# Patient Record
Sex: Female | Born: 1995 | Race: Black or African American | Hispanic: No | Marital: Single | Smoking: Never smoker
Health system: Southern US, Community
[De-identification: ages and names within clinical notes are randomized; demographics above are authoritative.]

## PROBLEM LIST (undated history)

## (undated) ENCOUNTER — Inpatient Hospital Stay (HOSPITAL_COMMUNITY): Payer: Self-pay

## (undated) DIAGNOSIS — N390 Urinary tract infection, site not specified: Secondary | ICD-10-CM

## (undated) DIAGNOSIS — L309 Dermatitis, unspecified: Secondary | ICD-10-CM

## (undated) DIAGNOSIS — F32A Depression, unspecified: Secondary | ICD-10-CM

## (undated) DIAGNOSIS — F419 Anxiety disorder, unspecified: Secondary | ICD-10-CM

## (undated) DIAGNOSIS — J45909 Unspecified asthma, uncomplicated: Secondary | ICD-10-CM

## (undated) HISTORY — PX: OTHER SURGICAL HISTORY: SHX169

## (undated) HISTORY — PX: NO PAST SURGERIES: SHX2092

## (undated) HISTORY — DX: Dermatitis, unspecified: L30.9

## (undated) HISTORY — PX: HERNIA REPAIR: SHX51

---
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
## 2014-05-15 NOTE — L&D Delivery Note (Cosign Needed)
Delivery Note At 12:00 AM a viable female was delivered via [facility] Vaginal, Vacuum (Extractor) (Presentation: Left Occiput Anterior) by Dr. Jolayne Panther due to maternal exhaustion and slowed progress after 3 hours of pushing.  No pop offs. The shoulders were not forthcoming, 1st maneuver: McRobert's, 2nd maneuver: suprapubic pressure which resolved dystocia. Total dystocia time ~30sec. APGAR: 8, 9; weight: pending at time of note.   Placenta status: Intact, Spontaneous.  Cord: 3 vessels with the following complications: None.  Cord pH: pending  Anesthesia: Epidural  Episiotomy: None Lacerations: 2nd degree Suture Repair: 3.0 vicryl rapide Est. Blood Loss (mL):  200  Mom to postpartum.  Baby to Couplet care / Skin to Skin. Plans to breastfeed, POPs for contraception.   [name] 11/01/2014, 12:37 AM

## 2014-06-11 ENCOUNTER — Other Ambulatory Visit (HOSPITAL_COMMUNITY): Payer: Self-pay | Admitting: Urology

## 2014-06-11 DIAGNOSIS — Z3689 Encounter for other specified antenatal screening: Secondary | ICD-10-CM

## 2014-06-11 LAB — OB RESULTS CONSOLE HGB/HCT, BLOOD
HEMATOCRIT: 33 %
Hemoglobin: 10.6 g/dL

## 2014-06-11 LAB — OB RESULTS CONSOLE RUBELLA ANTIBODY, IGM: Rubella: IMMUNE

## 2014-06-11 LAB — OB RESULTS CONSOLE PLATELET COUNT: PLATELETS: 297 10*3/uL

## 2014-06-11 LAB — OB RESULTS CONSOLE GC/CHLAMYDIA
Chlamydia: POSITIVE
Gonorrhea: NEGATIVE

## 2014-06-11 LAB — GLUCOSE TOLERANCE, 1 HOUR (50G) W/O FASTING: Glucose 1 Hr Prenatal, POC: 82 mg/dL

## 2014-06-11 LAB — CULTURE, OB URINE: Urine Culture, OB: NEGATIVE

## 2014-06-11 LAB — OB RESULTS CONSOLE ANTIBODY SCREEN: Antibody Screen: NEGATIVE

## 2014-06-11 LAB — DRUG SCREEN, URINE: Drug Screen, Urine: NEGATIVE

## 2014-06-11 LAB — OB RESULTS CONSOLE ABO/RH: RH TYPE: POSITIVE

## 2014-06-11 LAB — OB RESULTS CONSOLE RPR: RPR: NONREACTIVE

## 2014-06-11 LAB — PR MATERNAL SERUM QUAD SCREEN: QUAD SCREEN FOR MFM: NEGATIVE

## 2014-06-11 LAB — OB RESULTS CONSOLE HEPATITIS B SURFACE ANTIGEN: Hepatitis B Surface Ag: NEGATIVE

## 2014-06-11 LAB — OB RESULTS CONSOLE VARICELLA ZOSTER ANTIBODY, IGG: Varicella: IMMUNE

## 2014-06-11 LAB — OB RESULTS CONSOLE HIV ANTIBODY (ROUTINE TESTING): HIV: NONREACTIVE

## 2014-06-11 LAB — SICKLE CELL SCREEN: SICKLE CELL SCREEN: NORMAL

## 2014-06-11 LAB — CYSTIC FIBROSIS DIAGNOSTIC STUDY: INTERPRETATION-CFDNA: NEGATIVE

## 2014-06-18 ENCOUNTER — Other Ambulatory Visit (HOSPITAL_COMMUNITY): Payer: Self-pay | Admitting: Urology

## 2014-06-18 ENCOUNTER — Encounter (HOSPITAL_COMMUNITY): Payer: Self-pay

## 2014-06-18 ENCOUNTER — Ambulatory Visit (HOSPITAL_COMMUNITY)
Admission: RE | Admit: 2014-06-18 | Discharge: 2014-06-18 | Disposition: A | Discharge status: Home / Self Care | Payer: Medicaid Other | Source: Ambulatory Visit | Attending: Urology | Admitting: Urology

## 2014-06-18 DIAGNOSIS — Z3689 Encounter for other specified antenatal screening: Secondary | ICD-10-CM

## 2014-06-18 DIAGNOSIS — Z36 Encounter for antenatal screening of mother: Secondary | ICD-10-CM | POA: Diagnosis present

## 2014-06-18 DIAGNOSIS — Z3A21 21 weeks gestation of pregnancy: Secondary | ICD-10-CM | POA: Insufficient documentation

## 2014-06-26 ENCOUNTER — Other Ambulatory Visit (HOSPITAL_COMMUNITY): Payer: Self-pay | Admitting: Urology

## 2014-06-26 DIAGNOSIS — O402XX1 Polyhydramnios, second trimester, fetus 1: Secondary | ICD-10-CM

## 2014-06-26 DIAGNOSIS — Z0371 Encounter for suspected problem with amniotic cavity and membrane ruled out: Secondary | ICD-10-CM

## 2014-07-06 ENCOUNTER — Inpatient Hospital Stay (HOSPITAL_COMMUNITY)
Admission: AD | Admit: 2014-07-06 | Discharge: 2014-07-07 | Disposition: A | Discharge status: Home / Self Care | Payer: Medicaid Other | Source: Ambulatory Visit | Attending: Obstetrics & Gynecology | Admitting: Obstetrics & Gynecology

## 2014-07-06 ENCOUNTER — Encounter (HOSPITAL_COMMUNITY): Payer: Self-pay

## 2014-07-06 DIAGNOSIS — Z3492 Encounter for supervision of normal pregnancy, unspecified, second trimester: Secondary | ICD-10-CM

## 2014-07-06 DIAGNOSIS — O402XX0 Polyhydramnios, second trimester, not applicable or unspecified: Secondary | ICD-10-CM | POA: Insufficient documentation

## 2014-07-06 DIAGNOSIS — O4692 Antepartum hemorrhage, unspecified, second trimester: Secondary | ICD-10-CM

## 2014-07-06 DIAGNOSIS — Z3A24 24 weeks gestation of pregnancy: Secondary | ICD-10-CM | POA: Diagnosis not present

## 2014-07-06 DIAGNOSIS — N898 Other specified noninflammatory disorders of vagina: Secondary | ICD-10-CM | POA: Diagnosis present

## 2014-07-06 DIAGNOSIS — O4690 Antepartum hemorrhage, unspecified, unspecified trimester: Secondary | ICD-10-CM | POA: Insufficient documentation

## 2014-07-06 HISTORY — DX: Unspecified asthma, uncomplicated: J45.909

## 2014-07-06 NOTE — MAU Note (Signed)
Pt states that after she had intercourse this evening she began to have light pinkish discharge. Pt denies pain.

## 2014-07-07 ENCOUNTER — Inpatient Hospital Stay (HOSPITAL_COMMUNITY): Payer: Medicaid Other

## 2014-07-07 ENCOUNTER — Encounter (HOSPITAL_COMMUNITY): Payer: Self-pay | Admitting: *Deleted

## 2014-07-07 DIAGNOSIS — Z3A24 24 weeks gestation of pregnancy: Secondary | ICD-10-CM | POA: Insufficient documentation

## 2014-07-07 DIAGNOSIS — O4692 Antepartum hemorrhage, unspecified, second trimester: Secondary | ICD-10-CM

## 2014-07-07 DIAGNOSIS — O402XX0 Polyhydramnios, second trimester, not applicable or unspecified: Secondary | ICD-10-CM | POA: Insufficient documentation

## 2014-07-07 DIAGNOSIS — O4690 Antepartum hemorrhage, unspecified, unspecified trimester: Secondary | ICD-10-CM | POA: Insufficient documentation

## 2014-07-07 LAB — URINALYSIS, ROUTINE W REFLEX MICROSCOPIC
Bilirubin Urine: NEGATIVE
Glucose, UA: 100 mg/dL — AB
KETONES UR: 15 mg/dL — AB
LEUKOCYTES UA: NEGATIVE
Nitrite: NEGATIVE
Protein, ur: NEGATIVE mg/dL
SPECIFIC GRAVITY, URINE: 1.025 (ref 1.005–1.030)
Urobilinogen, UA: 1 mg/dL (ref 0.0–1.0)
pH: 6.5 (ref 5.0–8.0)

## 2014-07-07 LAB — URINE MICROSCOPIC-ADD ON

## 2014-07-07 LAB — WET PREP, GENITAL
CLUE CELLS WET PREP: NONE SEEN
TRICH WET PREP: NONE SEEN
Yeast Wet Prep HPF POC: NONE SEEN

## 2014-07-07 LAB — GC/CHLAMYDIA PROBE AMP (~~LOC~~) NOT AT ARMC
CHLAMYDIA, DNA PROBE: NEGATIVE
Neisseria Gonorrhea: NEGATIVE

## 2014-07-07 NOTE — MAU Note (Cosign Needed)
Chief Complaint:  Vaginal Bleeding   Nalani Vanderford is a 19 y.o.  G1P0 with IUP at [redacted]w[redacted]d presenting for Vaginal Bleeding  Patient was having sexual intercourse earlier this evening and noticed some drops of blood after wiping. There was no gush of fluid or large amount of blood. She reports good fetal movements. [facility] Her ultrasound showed mild polyhydramnios and an anterior placenta with no previa.   Menstrual History: OB History    Gravida Para Term Preterm AB TAB SAB Ectopic Multiple Living   1                   Past Medical History  Diagnosis Date  . Asthma     History reviewed. No pertinent past surgical history.  History reviewed. No pertinent family history.  History  Substance Use Topics  . Smoking status: Never Smoker   . Smokeless tobacco: Not on file  . Alcohol Use: No     No Known Allergies  Prescriptions prior to admission  Medication Sig Dispense Refill Last Dose  . Prenatal Vit-Fe Fumarate-FA (MULTIVITAMIN-PRENATAL) 27-0.8 MG TABS tablet Take 1 tablet by mouth daily at 12 noon.       Review of Systems - Negative except for what is mentioned in HPI.  Physical Exam  Blood pressure 116/63, pulse 88, temperature 98.4 F (36.9 C), temperature source Oral, resp. rate 18, height  (1.626 m), weight 61.78 kg (136 lb 3.2 oz). GENERAL: Well-developed, well-nourished female in no acute distress.  LUNGS: normal effort  HEART: Regular  ABDOMEN: Soft, nontender,, gravid.  EXTREMITIES: Nontender, no edema, 2+ distal pulses. GU: mild tears of labia minora, pooled blood in vault  FHT:  Baseline rate 145 bpm   Variability moderate  Accelerations present   Decelerations none Contractions: none   Labs: Results for orders placed or performed during the hospital encounter of 07/06/14 (from the past 24 hour(s))  Urinalysis, Routine w reflex microscopic   Collection Time: 07/06/14 11:41 PM  Result Value Ref Range    Color, Urine YELLOW YELLOW   APPearance CLEAR CLEAR   Specific Gravity, Urine 1.025 1.005 - 1.030   pH 6.5 5.0 - 8.0   Glucose, UA 100 (A) NEGATIVE mg/dL   Hgb urine dipstick TRACE (A) NEGATIVE   Bilirubin Urine NEGATIVE NEGATIVE   Ketones, ur 15 (A) NEGATIVE mg/dL   Protein, ur NEGATIVE NEGATIVE mg/dL   Urobilinogen, UA 1.0 0.0 - 1.0 mg/dL   Nitrite NEGATIVE NEGATIVE   Leukocytes, UA NEGATIVE NEGATIVE  Urine microscopic-add on   Collection Time: 07/06/14 11:41 PM  Result Value Ref Range   Squamous Epithelial / LPF RARE RARE   WBC, UA 0-2 <3 WBC/hpf   RBC / HPF 0-2 <3 RBC/hpf   Bacteria, UA RARE RARE   Urine-Other MUCOUS PRESENT     Imaging Studies:  US Ob Detail + 14 Wk  06/18/2014   OBSTETRICAL ULTRASOUND: This exam was performed within a [facility]. The OB US report was generated in the [facility] system, and faxed to the ordering physician.   This report is available in the [employer]. See the [facility] Obstetric US report via the Image Link.   Assessment: Sumaiyah Markert is  19 y.o. G1P0 at [redacted]w[redacted]d presents with Vaginal Bleeding .  Plan: Vaginal Bleeding: Most likely 2/2 coitus. Wet prep negative, GC/Ch pending. US showing no previa but does show polyhydramnios and breech presentation.  - dc to home.  -  given precautions for return  - has appt this [facility] at [facility].   Mild polyhydramnios: [facility]. She should have a repeat ultrasound ~07/17/14.   Myra RudeSchmitz, Jeremy E 2/23/201612:31 AM  OB fellow attestation:  I have seen and examined this patient; I agree with above documentation in the resident's note.   Rico JunkerDuranda Kimrey is a 19 y.o. G1P0 reporting scant vaginal bleeding after intercourse this evening. She initially noticed a small amount of pink on the toilet paper when wiping. Has not continued to bleed.  +FM, denies LOF, VB, contractions, vaginal discharge.  PE: BP 116/63 mmHg  Pulse 88   Temp(Src) 98.4 F (36.9 C) (Oral)  Resp 18  Ht 5\' 4"  (1.626 m)  Wt 136 lb 3.2 oz (61.78 kg)  BMI 23.37 kg/m2 Gen: calm comfortable, NAD Resp: normal effort, no distress Abd: gravid  ROS, labs, PMH reviewed NST reactive   Plan: #Post coital bleeding Small amount of blood in the vaginal vault on examination. No evidence of active bleeding.  - Wet prep negative.  - GC/CT pending - Ultrasound without evidence of previa or abruption - Follow up scheduled for Thursday, stressed the importance of keeping this appointment as scheduled - Strict VB/FM/preterm labor precautions given   William DaltonMcEachern, Joana Nolton, MD 2:38 AM

## 2014-07-07 NOTE — Discharge Instructions (Signed)
Second Trimester of Pregnancy The second trimester is from week 13 through week 28, months 4 through 6. The second trimester is often a time when you feel your best. Your body has also adjusted to being pregnant, and you begin to feel better physically. Usually, morning sickness has lessened or quit completely, you may have more energy, and you may have an increase in appetite. The second trimester is also a time when the fetus is growing rapidly. At the end of the sixth month, the fetus is about 9 inches long and weighs about 1 pounds. You will likely begin to feel the baby move (quickening) between 18 and 20 weeks of the pregnancy. BODY CHANGES Your body goes through many changes during pregnancy. The changes vary from woman to woman.   Your weight will continue to increase. You will notice your lower abdomen bulging out.  You may begin to get stretch marks on your hips, abdomen, and breasts.  You may develop headaches that can be relieved by medicines approved by your health care provider.  You may urinate more often because the fetus is pressing on your bladder.  You may develop or continue to have heartburn as a result of your pregnancy.  You may develop constipation because certain hormones are causing the muscles that push waste through your intestines to slow down.  You may develop hemorrhoids or swollen, bulging veins (varicose veins).  You may have back pain because of the weight gain and pregnancy hormones relaxing your joints between the bones in your pelvis and as a result of a shift in weight and the muscles that support your balance.  Your breasts will continue to grow and be tender.  Your gums may bleed and may be sensitive to brushing and flossing.  Dark spots or blotches (chloasma, mask of pregnancy) may develop on your face. This will likely fade after the baby is born.  A dark line from your belly button to the pubic area (linea nigra) may appear. This will likely fade  after the baby is born.  You may have changes in your hair. These can include thickening of your hair, rapid growth, and changes in texture. Some women also have hair loss during or after pregnancy, or hair that feels dry or thin. Your hair will most likely return to normal after your baby is born. WHAT TO EXPECT AT YOUR PRENATAL VISITS During a routine prenatal visit:  You will be weighed to make sure you and the fetus are growing normally.  Your blood pressure will be taken.  Your abdomen will be measured to track your baby's growth.  The fetal heartbeat will be listened to.  Any test results from the previous visit will be discussed. Your health care provider may ask you:  How you are feeling.  If you are feeling the baby move.  If you have had any abnormal symptoms, such as leaking fluid, bleeding, severe headaches, or abdominal cramping.  If you have any questions. Other tests that may be performed during your second trimester include:  Blood tests that check for:  Low iron levels (anemia).  Gestational diabetes (between 24 and 28 weeks).  Rh antibodies.  Urine tests to check for infections, diabetes, or protein in the urine.  An ultrasound to confirm the proper growth and development of the baby.  An amniocentesis to check for possible genetic problems.  Fetal screens for spina bifida and Down syndrome. HOME CARE INSTRUCTIONS   Avoid all smoking, herbs, alcohol, and unprescribed   drugs. These chemicals affect the formation and growth of the baby.  Follow your health care provider's instructions regarding medicine use. There are medicines that are either safe or unsafe to take during pregnancy.  Exercise only as directed by your health care provider. Experiencing uterine cramps is a good sign to stop exercising.  Continue to eat regular, healthy meals.  Wear a good support bra for breast tenderness.  Do not use hot tubs, steam rooms, or saunas.  Wear your  seat belt at all times when driving.  Avoid raw meat, uncooked cheese, cat litter boxes, and soil used by cats. These carry germs that can cause birth defects in the baby.  Take your prenatal vitamins.  Try taking a stool softener (if your health care provider approves) if you develop constipation. Eat more high-fiber foods, such as fresh vegetables or fruit and whole grains. Drink plenty of fluids to keep your urine clear or pale yellow.  Take warm sitz baths to soothe any pain or discomfort caused by hemorrhoids. Use hemorrhoid cream if your health care provider approves.  If you develop varicose veins, wear support hose. Elevate your feet for 15 minutes, 3-4 times a day. Limit salt in your diet.  Avoid heavy lifting, wear low heel shoes, and practice good posture.  Rest with your legs elevated if you have leg cramps or low back pain.  Visit your dentist if you have not gone yet during your pregnancy. Use a soft toothbrush to brush your teeth and be gentle when you floss.  A sexual relationship may be continued unless your health care provider directs you otherwise.  Continue to go to all your prenatal visits as directed by your health care provider. SEEK MEDICAL CARE IF:   You have dizziness.  You have mild pelvic cramps, pelvic pressure, or nagging pain in the abdominal area.  You have persistent nausea, vomiting, or diarrhea.  You have a bad smelling vaginal discharge.  You have pain with urination. SEEK IMMEDIATE MEDICAL CARE IF:   You have a fever.  You are leaking fluid from your vagina.  You have spotting or bleeding from your vagina.  You have severe abdominal cramping or pain.  You have rapid weight gain or loss.  You have shortness of breath with chest pain.  You notice sudden or extreme swelling of your face, hands, ankles, feet, or legs.  You have not felt your baby move in over an hour.  You have severe headaches that do not go away with  medicine.  You have vision changes. Document Released: 04/25/2001 Document Revised: 05/06/2013 Document Reviewed: 07/02/2012 [employer] Patient Information 2015 [employer]. This information is not intended to replace advice given to you by your health care provider. Make sure you discuss any questions you have with your health care provider.  

## 2014-07-16 ENCOUNTER — Encounter (HOSPITAL_COMMUNITY): Payer: Self-pay

## 2014-07-16 ENCOUNTER — Other Ambulatory Visit (HOSPITAL_COMMUNITY): Payer: Self-pay | Admitting: Urology

## 2014-07-16 ENCOUNTER — Ambulatory Visit (HOSPITAL_COMMUNITY)
Admission: RE | Admit: 2014-07-16 | Discharge: 2014-07-16 | Disposition: A | Discharge status: Home / Self Care | Payer: Medicaid Other | Source: Ambulatory Visit | Attending: Physician Assistant | Admitting: Physician Assistant

## 2014-07-16 DIAGNOSIS — Z3A25 25 weeks gestation of pregnancy: Secondary | ICD-10-CM | POA: Diagnosis not present

## 2014-07-16 DIAGNOSIS — O402XX0 Polyhydramnios, second trimester, not applicable or unspecified: Secondary | ICD-10-CM | POA: Diagnosis not present

## 2014-07-16 DIAGNOSIS — O409XX0 Polyhydramnios, unspecified trimester, not applicable or unspecified: Secondary | ICD-10-CM | POA: Insufficient documentation

## 2014-07-16 DIAGNOSIS — Z0371 Encounter for suspected problem with amniotic cavity and membrane ruled out: Secondary | ICD-10-CM

## 2014-07-23 ENCOUNTER — Encounter: Payer: Medicaid Other | Admitting: Family Medicine

## 2014-07-29 ENCOUNTER — Other Ambulatory Visit (HOSPITAL_COMMUNITY): Payer: Self-pay | Admitting: Obstetrics and Gynecology

## 2014-07-29 DIAGNOSIS — O409XX9 Polyhydramnios, unspecified trimester, other fetus: Secondary | ICD-10-CM

## 2014-08-03 DIAGNOSIS — Z202 Contact with and (suspected) exposure to infections with a predominantly sexual mode of transmission: Secondary | ICD-10-CM | POA: Insufficient documentation

## 2014-08-03 DIAGNOSIS — D649 Anemia, unspecified: Secondary | ICD-10-CM | POA: Insufficient documentation

## 2014-08-05 ENCOUNTER — Encounter: Payer: Self-pay | Admitting: *Deleted

## 2014-08-06 ENCOUNTER — Encounter: Payer: Self-pay | Admitting: Obstetrics and Gynecology

## 2014-08-06 ENCOUNTER — Encounter (HOSPITAL_COMMUNITY): Payer: Self-pay

## 2014-08-06 ENCOUNTER — Ambulatory Visit (HOSPITAL_COMMUNITY)
Admission: RE | Admit: 2014-08-06 | Discharge: 2014-08-06 | Disposition: A | Discharge status: Home / Self Care | Payer: Medicaid Other | Source: Ambulatory Visit | Attending: Obstetrics and Gynecology | Admitting: Obstetrics and Gynecology

## 2014-08-06 ENCOUNTER — Other Ambulatory Visit: Payer: Self-pay | Admitting: Obstetrics and Gynecology

## 2014-08-06 ENCOUNTER — Ambulatory Visit (INDEPENDENT_AMBULATORY_CARE_PROVIDER_SITE_OTHER): Payer: Medicaid Other | Admitting: Obstetrics and Gynecology

## 2014-08-06 VITALS — BP 110/60 | HR 81 | Temp 98.2°F | Wt 137.4 lb

## 2014-08-06 DIAGNOSIS — O403XX1 Polyhydramnios, third trimester, fetus 1: Secondary | ICD-10-CM

## 2014-08-06 DIAGNOSIS — Z23 Encounter for immunization: Secondary | ICD-10-CM

## 2014-08-06 DIAGNOSIS — O409XX0 Polyhydramnios, unspecified trimester, not applicable or unspecified: Secondary | ICD-10-CM | POA: Insufficient documentation

## 2014-08-06 DIAGNOSIS — O0993 Supervision of high risk pregnancy, unspecified, third trimester: Secondary | ICD-10-CM | POA: Diagnosis present

## 2014-08-06 DIAGNOSIS — Z3A28 28 weeks gestation of pregnancy: Secondary | ICD-10-CM | POA: Insufficient documentation

## 2014-08-06 DIAGNOSIS — O0990 Supervision of high risk pregnancy, unspecified, unspecified trimester: Secondary | ICD-10-CM | POA: Insufficient documentation

## 2014-08-06 LAB — CBC
HCT: 32.2 % — ABNORMAL LOW (ref 36.0–46.0)
Hemoglobin: 11.1 g/dL — ABNORMAL LOW (ref 12.0–15.0)
MCH: 30.7 pg (ref 26.0–34.0)
MCHC: 34.5 g/dL (ref 30.0–36.0)
MCV: 89.2 fL (ref 78.0–100.0)
MPV: 10.3 fL (ref 8.6–12.4)
Platelets: 252 10*3/uL (ref 150–400)
RBC: 3.61 MIL/uL — AB (ref 3.87–5.11)
RDW: 13.7 % (ref 11.5–15.5)
WBC: 5.8 10*3/uL (ref 4.0–10.5)

## 2014-08-06 LAB — POCT URINALYSIS DIP (DEVICE)
Bilirubin Urine: NEGATIVE
GLUCOSE, UA: NEGATIVE mg/dL
Hgb urine dipstick: NEGATIVE
Ketones, ur: NEGATIVE mg/dL
Nitrite: NEGATIVE
Protein, ur: NEGATIVE mg/dL
Urobilinogen, UA: 0.2 mg/dL (ref 0.0–1.0)
pH: 6 (ref 5.0–8.0)

## 2014-08-06 MED ORDER — TETANUS-DIPHTH-ACELL PERTUSSIS 5-2.5-18.5 LF-MCG/0.5 IM SUSP
0.5000 mL | Freq: Once | INTRAMUSCULAR | Status: AC
  Administered 2014-08-06: 0.5 mL via INTRAMUSCULAR

## 2014-08-06 NOTE — Progress Notes (Signed)
Nutrition note: 1st visit consult Pt has gained 17.4# @ 770w2d, which is wnl. Pt reports eating 3 meals & 4 snacks/d. Pt is taking a PNV. Pt reports no N&V but has some heartburn occ. Pt received verbal & written education on general nutrition during pregnancy. Discussed BF. Discussed wt gain goals of 25-35# or 1#/wk. Pt agrees to continue taking PNV. Pt has WIC & plans to BF. F/u as needed Blondell RevealLaura Govani Radloff, MS, RD, LDN, [facility]CLC

## 2014-08-06 NOTE — Patient Instructions (Signed)
Third Trimester of Pregnancy The third trimester is from week 29 through week 42, months 7 through 9. The third trimester is a time when the fetus is growing rapidly. At the end of the ninth month, the fetus is about 20 inches in length and weighs 6-10 pounds.  BODY CHANGES Your body goes through many changes during pregnancy. The changes vary from woman to woman.   Your weight will continue to increase. You can expect to gain 25-35 pounds (11-16 kg) by the end of the pregnancy.  You may begin to get stretch marks on your hips, abdomen, and breasts.  You may urinate more often because the fetus is moving lower into your pelvis and pressing on your bladder.  You may develop or continue to have heartburn as a result of your pregnancy.  You may develop constipation because certain hormones are causing the muscles that push waste through your intestines to slow down.  You may develop hemorrhoids or swollen, bulging veins (varicose veins).  You may have pelvic pain because of the weight gain and pregnancy hormones relaxing your joints between the bones in your pelvis. Backaches may result from overexertion of the muscles supporting your posture.  You may have changes in your hair. These can include thickening of your hair, rapid growth, and changes in texture. Some women also have hair loss during or after pregnancy, or hair that feels dry or thin. Your hair will most likely return to normal after your baby is born.  Your breasts will continue to grow and be tender. A yellow discharge may leak from your breasts called colostrum.  Your belly button may stick out.  You may feel short of breath because of your expanding uterus.  You may notice the fetus "dropping," or moving lower in your abdomen.  You may have a bloody mucus discharge. This usually occurs a few days to a week before labor begins.  Your cervix becomes thin and soft (effaced) near your due date. WHAT TO EXPECT AT YOUR  PRENATAL EXAMS  You will have prenatal exams every 2 weeks until week 36. Then, you will have weekly prenatal exams. During a routine prenatal visit:  You will be weighed to make sure you and the fetus are growing normally.  Your blood pressure is taken.  Your abdomen will be measured to track your baby's growth.  The fetal heartbeat will be listened to.  Any test results from the previous visit will be discussed.  You may have a cervical check near your due date to see if you have effaced. At around 36 weeks, your caregiver will check your cervix. At the same time, your caregiver will also perform a test on the secretions of the vaginal tissue. This test is to determine if a type of bacteria, Group B streptococcus, is present. Your caregiver will explain this further. Your caregiver may ask you:  What your birth plan is.  How you are feeling.  If you are feeling the baby move.  If you have had any abnormal symptoms, such as leaking fluid, bleeding, severe headaches, or abdominal cramping.  If you have any questions. Other tests or screenings that may be performed during your third trimester include:  Blood tests that check for low iron levels (anemia).  Fetal testing to check the health, activity level, and growth of the fetus. Testing is done if you have certain medical conditions or if there are problems during the pregnancy. FALSE LABOR You may feel small, irregular contractions that   eventually go away. These are called Braxton Hicks contractions, or false labor. Contractions may last for hours, days, or even weeks before true labor sets in. If contractions come at regular intervals, intensify, or become painful, it is best to be seen by your caregiver.  SIGNS OF LABOR   Menstrual-like cramps.  Contractions that are 5 minutes apart or less.  Contractions that start on the top of the uterus and spread down to the lower abdomen and back.  A sense of increased pelvic  pressure or back pain.  A watery or bloody mucus discharge that comes from the vagina. If you have any of these signs before the 37th week of pregnancy, call your caregiver right away. You need to go to the hospital to get checked immediately. HOME CARE INSTRUCTIONS   Avoid all smoking, herbs, alcohol, and unprescribed drugs. These chemicals affect the formation and growth of the baby.  Follow your caregiver's instructions regarding medicine use. There are medicines that are either safe or unsafe to take during pregnancy.  Exercise only as directed by your caregiver. Experiencing uterine cramps is a good sign to stop exercising.  Continue to eat regular, healthy meals.  Wear a good support bra for breast tenderness.  Do not use hot tubs, steam rooms, or saunas.  Wear your seat belt at all times when driving.  Avoid raw meat, uncooked cheese, cat litter boxes, and soil used by cats. These carry germs that can cause birth defects in the baby.  Take your prenatal vitamins.  Try taking a stool softener (if your caregiver approves) if you develop constipation. Eat more high-fiber foods, such as fresh vegetables or fruit and whole grains. Drink plenty of fluids to keep your urine clear or pale yellow.  Take warm sitz baths to soothe any pain or discomfort caused by hemorrhoids. Use hemorrhoid cream if your caregiver approves.  If you develop varicose veins, wear support hose. Elevate your feet for 15 minutes, 3-4 times a day. Limit salt in your diet.  Avoid heavy lifting, wear low heal shoes, and practice good posture.  Rest a lot with your legs elevated if you have leg cramps or low back pain.  Visit your dentist if you have not gone during your pregnancy. Use a soft toothbrush to brush your teeth and be gentle when you floss.  A sexual relationship may be continued unless your caregiver directs you otherwise.  Do not travel far distances unless it is absolutely necessary and only  with the approval of your caregiver.  Take prenatal classes to understand, practice, and ask questions about the labor and delivery.  Make a trial run to the hospital.  Pack your hospital bag.  Prepare the baby's nursery.  Continue to go to all your prenatal visits as directed by your caregiver. SEEK MEDICAL CARE IF:  You are unsure if you are in labor or if your water has broken.  You have dizziness.  You have mild pelvic cramps, pelvic pressure, or nagging pain in your abdominal area.  You have persistent nausea, vomiting, or diarrhea.  You have a bad smelling vaginal discharge.  You have pain with urination. SEEK IMMEDIATE MEDICAL CARE IF:   You have a fever.  You are leaking fluid from your vagina.  You have spotting or bleeding from your vagina.  You have severe abdominal cramping or pain.  You have rapid weight loss or gain.  You have shortness of breath with chest pain.  You notice sudden or extreme swelling   of your face, hands, ankles, feet, or legs.  You have not felt your baby move in over an hour.  You have severe headaches that do not go away with medicine.  You have vision changes. Document Released: 04/25/2001 Document Revised: 05/06/2013 Document Reviewed: 07/02/2012 [employer] Patient Information 2015 [employer]. This information is not intended to replace advice given to you by your health care provider. Make sure you discuss any questions you have with your health care provider.  Contraception Choices Contraception (birth control) is the use of any methods or devices to prevent pregnancy. Below are some methods to help avoid pregnancy. HORMONAL METHODS   Contraceptive implant. This is a thin, plastic tube containing progesterone hormone. It does not contain estrogen hormone. Your health care provider inserts the tube in the inner part of the upper arm. The tube can remain in place for up to 3 years. After 3 years, the implant must be removed.  The implant prevents the ovaries from releasing an egg (ovulation), thickens the cervical mucus to prevent sperm from entering the uterus, and thins the lining of the inside of the uterus.  Progesterone-only injections. These injections are given every 3 months by your health care provider to prevent pregnancy. This synthetic progesterone hormone stops the ovaries from releasing eggs. It also thickens cervical mucus and changes the uterine lining. This makes it harder for sperm to survive in the uterus.  Birth control pills. These pills contain estrogen and progesterone hormone. They work by preventing the ovaries from releasing eggs (ovulation). They also cause the cervical mucus to thicken, preventing the sperm from entering the uterus. Birth control pills are prescribed by a health care provider.Birth control pills can also be used to treat heavy periods.  Minipill. This type of birth control pill contains only the progesterone hormone. They are taken every day of each month and must be prescribed by your health care provider.  Birth control patch. The patch contains hormones similar to those in birth control pills. It must be changed once a week and is prescribed by a health care provider.  Vaginal ring. The ring contains hormones similar to those in birth control pills. It is left in the vagina for 3 weeks, removed for 1 week, and then a new one is put back in place. The patient must be comfortable inserting and removing the ring from the vagina.A health care provider's prescription is necessary.  Emergency contraception. Emergency contraceptives prevent pregnancy after unprotected sexual intercourse. This pill can be taken right after sex or up to 5 days after unprotected sex. It is most effective the sooner you take the pills after having sexual intercourse. Most emergency contraceptive pills are available without a prescription. Check with your pharmacist. Do not use emergency contraception as  your only form of birth control. BARRIER METHODS   Female condom. This is a thin sheath (latex or rubber) that is worn over the penis during sexual intercourse. It can be used with spermicide to increase effectiveness.  Female condom. This is a soft, loose-fitting sheath that is put into the vagina before sexual intercourse.  Diaphragm. This is a soft, latex, dome-shaped barrier that must be fitted by a health care provider. It is inserted into the vagina, along with a spermicidal jelly. It is inserted before intercourse. The diaphragm should be left in the vagina for 6 to 8 hours after intercourse.  Cervical cap. This is a round, soft, latex or plastic cup that fits over the cervix and must be   fitted by a health care provider. The cap can be left in place for up to 48 hours after intercourse.  Sponge. This is a soft, circular piece of polyurethane foam. The sponge has spermicide in it. It is inserted into the vagina after wetting it and before sexual intercourse.  Spermicides. These are chemicals that kill or block sperm from entering the cervix and uterus. They come in the form of creams, jellies, suppositories, foam, or tablets. They do not require a prescription. They are inserted into the vagina with an applicator before having sexual intercourse. The process must be repeated every time you have sexual intercourse. INTRAUTERINE CONTRACEPTION  Intrauterine device (IUD). This is a T-shaped device that is put in a woman's uterus during a menstrual period to prevent pregnancy. There are 2 types:  Copper IUD. This type of IUD is wrapped in copper wire and is placed inside the uterus. Copper makes the uterus and fallopian tubes produce a fluid that kills sperm. It can stay in place for 10 years.  Hormone IUD. This type of IUD contains the hormone progestin (synthetic progesterone). The hormone thickens the cervical mucus and prevents sperm from entering the uterus, and it also thins the uterine  lining to prevent implantation of a fertilized egg. The hormone can weaken or kill the sperm that get into the uterus. It can stay in place for 3-5 years, depending on which type of IUD is used. PERMANENT METHODS OF CONTRACEPTION  Female tubal ligation. This is when the woman's fallopian tubes are surgically sealed, tied, or blocked to prevent the egg from traveling to the uterus.  Hysteroscopic sterilization. This involves placing a small coil or insert into each fallopian tube. Your doctor uses a technique called hysteroscopy to do the procedure. The device causes scar tissue to form. This results in permanent blockage of the fallopian tubes, so the sperm cannot fertilize the egg. It takes about 3 months after the procedure for the tubes to become blocked. You must use another form of birth control for these 3 months.  Female sterilization. This is when the female has the tubes that carry sperm tied off (vasectomy).This blocks sperm from entering the vagina during sexual intercourse. After the procedure, the man can still ejaculate fluid (semen). NATURAL PLANNING METHODS  Natural family planning. This is not having sexual intercourse or using a barrier method (condom, diaphragm, cervical cap) on days the woman could become pregnant.  Calendar method. This is keeping track of the length of each menstrual cycle and identifying when you are fertile.  Ovulation method. This is avoiding sexual intercourse during ovulation.  Symptothermal method. This is avoiding sexual intercourse during ovulation, using a thermometer and ovulation symptoms.  Post-ovulation method. This is timing sexual intercourse after you have ovulated. Regardless of which type or method of contraception you choose, it is important that you use condoms to protect against the transmission of sexually transmitted infections (STIs). Talk with your health care provider about which form of contraception is most appropriate for  you. Document Released: 05/01/2005 Document Revised: 05/06/2013 Document Reviewed: 10/24/2012 [employer] Patient Information 2015 [employer]. This information is not intended to replace advice given to you by your health care provider. Make sure you discuss any questions you have with your health care provider.  Breastfeeding Deciding to breastfeed is one of the best choices you can make for you and your baby. A change in hormones during pregnancy causes your breast tissue to grow and increases the number and size of   your milk ducts. These hormones also allow proteins, sugars, and fats from your blood supply to make breast milk in your milk-producing glands. Hormones prevent breast milk from being released before your baby is born as well as prompt milk flow after birth. Once breastfeeding has begun, thoughts of your baby, as well as his or her sucking or crying, can stimulate the release of milk from your milk-producing glands.  BENEFITS OF BREASTFEEDING For Your Baby  Your first milk (colostrum) helps your baby's digestive system function better.   There are antibodies in your milk that help your baby fight off infections.   Your baby has a lower incidence of asthma, allergies, and sudden infant death syndrome.   The nutrients in breast milk are better for your baby than infant formulas and are designed uniquely for your baby's needs.   Breast milk improves your baby's brain development.   Your baby is less likely to develop other conditions, such as childhood obesity, asthma, or type 2 diabetes mellitus.  For You   Breastfeeding helps to create a very special bond between you and your baby.   Breastfeeding is convenient. Breast milk is always available at the correct temperature and costs nothing.   Breastfeeding helps to burn calories and helps you lose the weight gained during pregnancy.   Breastfeeding makes your uterus contract to its prepregnancy size faster and slows  bleeding (lochia) after you give birth.   Breastfeeding helps to lower your risk of developing type 2 diabetes mellitus, osteoporosis, and breast or ovarian cancer later in life. SIGNS THAT YOUR BABY IS HUNGRY Early Signs of Hunger  Increased alertness or activity.  Stretching.  Movement of the head from side to side.  Movement of the head and opening of the mouth when the corner of the mouth or cheek is stroked (rooting).  Increased sucking sounds, smacking lips, cooing, sighing, or squeaking.  Hand-to-mouth movements.  Increased sucking of fingers or hands. Late Signs of Hunger  Fussing.  Intermittent crying. Extreme Signs of Hunger Signs of extreme hunger will require calming and consoling before your baby will be able to breastfeed successfully. Do not wait for the following signs of extreme hunger to occur before you initiate breastfeeding:   Restlessness.  A loud, strong cry.   Screaming. BREASTFEEDING BASICS Breastfeeding Initiation  Find a comfortable place to sit or lie down, with your neck and back well supported.  Place a pillow or rolled up blanket under your baby to bring him or her to the level of your breast (if you are seated). Nursing pillows are specially designed to help support your arms and your baby while you breastfeed.  Make sure that your baby's abdomen is facing your abdomen.   Gently massage your breast. With your fingertips, massage from your chest wall toward your nipple in a circular motion. This encourages milk flow. You may need to continue this action during the feeding if your milk flows slowly.  Support your breast with 4 fingers underneath and your thumb above your nipple. Make sure your fingers are well away from your nipple and your baby's mouth.   Stroke your baby's lips gently with your finger or nipple.   When your baby's mouth is open wide enough, quickly bring your baby to your breast, placing your entire nipple and as  much of the colored area around your nipple (areola) as possible into your baby's mouth.   More areola should be visible above your baby's upper lip than below   the lower lip.   Your baby's tongue should be between his or her lower gum and your breast.   Ensure that your baby's mouth is correctly positioned around your nipple (latched). Your baby's lips should create a seal on your breast and be turned out (everted).  It is common for your baby to suck about 2-3 minutes in order to start the flow of breast milk. Latching Teaching your baby how to latch on to your breast properly is very important. An improper latch can cause nipple pain and decreased milk supply for you and poor weight gain in your baby. Also, if your baby is not latched onto your nipple properly, he or she may swallow some air during feeding. This can make your baby fussy. Burping your baby when you switch breasts during the feeding can help to get rid of the air. However, teaching your baby to latch on properly is still the best way to prevent fussiness from swallowing air while breastfeeding. Signs that your baby has successfully latched on to your nipple:    Silent tugging or silent sucking, without causing you pain.   Swallowing heard between every 3-4 sucks.    Muscle movement above and in front of his or her ears while sucking.  Signs that your baby has not successfully latched on to nipple:   Sucking sounds or smacking sounds from your baby while breastfeeding.  Nipple pain. If you think your baby has not latched on correctly, slip your finger into the corner of your baby's mouth to break the suction and place it between your baby's gums. Attempt breastfeeding initiation again. Signs of Successful Breastfeeding Signs from your baby:   A gradual decrease in the number of sucks or complete cessation of sucking.   Falling asleep.   Relaxation of his or her body.   Retention of a small amount of milk in  his or her mouth.   Letting go of your breast by himself or herself. Signs from you:  Breasts that have increased in firmness, weight, and size 1-3 hours after feeding.   Breasts that are softer immediately after breastfeeding.  Increased milk volume, as well as a change in milk consistency and color by the fifth day of breastfeeding.   Nipples that are not sore, cracked, or bleeding. Signs That Your Baby is Getting Enough Milk  Wetting at least 3 diapers in a 24-hour period. The urine should be clear and pale yellow by age 5 days.  At least 3 stools in a 24-hour period by age 5 days. The stool should be soft and yellow.  At least 3 stools in a 24-hour period by age 7 days. The stool should be seedy and yellow.  No loss of weight greater than 10% of birth weight during the first 3 days of age.  Average weight gain of 4-7 ounces (113-198 g) per week after age 4 days.  Consistent daily weight gain by age 5 days, without weight loss after the age of 2 weeks. After a feeding, your baby may spit up a small amount. This is common. BREASTFEEDING FREQUENCY AND DURATION Frequent feeding will help you make more milk and can prevent sore nipples and breast engorgement. Breastfeed when you feel the need to reduce the fullness of your breasts or when your baby shows signs of hunger. This is called "breastfeeding on demand." Avoid introducing a pacifier to your baby while you are working to establish breastfeeding (the first 4-6 weeks after your baby is born).   After this time you may choose to use a pacifier. Research has shown that pacifier use during the first year of a baby's life decreases the risk of sudden infant death syndrome (SIDS). Allow your baby to feed on each breast as long as he or she wants. Breastfeed until your baby is finished feeding. When your baby unlatches or falls asleep while feeding from the first breast, offer the second breast. Because newborns are often sleepy in the  first few weeks of life, you may need to awaken your baby to get him or her to feed. Breastfeeding times will vary from baby to baby. However, the following rules can serve as a guide to help you ensure that your baby is properly fed:  Newborns (babies 4 weeks of age or younger) may breastfeed every 1-3 hours.  Newborns should not go longer than 3 hours during the day or 5 hours during the night without breastfeeding.  You should breastfeed your baby a minimum of 8 times in a 24-hour period until you begin to introduce solid foods to your baby at around 6 months of age. BREAST MILK PUMPING Pumping and storing breast milk allows you to ensure that your baby is exclusively fed your breast milk, even at times when you are unable to breastfeed. This is especially important if you are going back to work while you are still breastfeeding or when you are not able to be present during feedings. Your lactation consultant can give you guidelines on how long it is safe to store breast milk.  A breast pump is a machine that allows you to pump milk from your breast into a sterile bottle. The pumped breast milk can then be stored in a refrigerator or freezer. Some breast pumps are operated by hand, while others use electricity. Ask your lactation consultant which type will work best for you. Breast pumps can be purchased, but some hospitals and breastfeeding support groups lease breast pumps on a monthly basis. A lactation consultant can teach you how to hand express breast milk, if you prefer not to use a pump.  CARING FOR YOUR BREASTS WHILE YOU BREASTFEED Nipples can become dry, cracked, and sore while breastfeeding. The following recommendations can help keep your breasts moisturized and healthy:  Avoid using soap on your nipples.   Wear a supportive bra. Although not required, special nursing bras and tank tops are designed to allow access to your breasts for breastfeeding without taking off your entire bra  or top. Avoid wearing underwire-style bras or extremely tight bras.  Air dry your nipples for 3-4minutes after each feeding.   Use only cotton bra pads to absorb leaked breast milk. Leaking of breast milk between feedings is normal.   Use lanolin on your nipples after breastfeeding. Lanolin helps to maintain your skin's normal moisture barrier. If you use pure lanolin, you do not need to wash it off before feeding your baby again. Pure lanolin is not toxic to your baby. You may also hand express a few drops of breast milk and gently massage that milk into your nipples and allow the milk to air dry. In the first few weeks after giving birth, some women experience extremely full breasts (engorgement). Engorgement can make your breasts feel heavy, warm, and tender to the touch. Engorgement peaks within 3-5 days after you give birth. The following recommendations can help ease engorgement:  Completely empty your breasts while breastfeeding or pumping. You may want to start by applying warm, moist heat (in   the shower or with warm water-soaked hand towels) just before feeding or pumping. This increases circulation and helps the milk flow. If your baby does not completely empty your breasts while breastfeeding, pump any extra milk after he or she is finished.  Wear a snug bra (nursing or regular) or tank top for 1-2 days to signal your body to slightly decrease milk production.  Apply ice packs to your breasts, unless this is too uncomfortable for you.  Make sure that your baby is latched on and positioned properly while breastfeeding. If engorgement persists after 48 hours of following these recommendations, contact your health care provider or a lactation consultant. OVERALL HEALTH CARE RECOMMENDATIONS WHILE BREASTFEEDING  Eat healthy foods. Alternate between meals and snacks, eating 3 of each per day. Because what you eat affects your breast milk, some of the foods may make your baby more irritable  than usual. Avoid eating these foods if you are sure that they are negatively affecting your baby.  Drink milk, fruit juice, and water to satisfy your thirst (about 10 glasses a day).   Rest often, relax, and continue to take your prenatal vitamins to prevent fatigue, stress, and anemia.  Continue breast self-awareness checks.  Avoid chewing and smoking tobacco.  Avoid alcohol and drug use. Some medicines that may be harmful to your baby can pass through breast milk. It is important to ask your health care provider before taking any medicine, including all over-the-counter and prescription medicine as well as vitamin and herbal supplements. It is possible to become pregnant while breastfeeding. If birth control is desired, ask your health care provider about options that will be safe for your baby. SEEK MEDICAL CARE IF:   You feel like you want to stop breastfeeding or have become frustrated with breastfeeding.  You have painful breasts or nipples.  Your nipples are cracked or bleeding.  Your breasts are red, tender, or warm.  You have a swollen area on either breast.  You have a fever or chills.  You have nausea or vomiting.  You have drainage other than breast milk from your nipples.  Your breasts do not become full before feedings by the fifth day after you give birth.  You feel sad and depressed.  Your baby is too sleepy to eat well.  Your baby is having trouble sleeping.   Your baby is wetting less than 3 diapers in a 24-hour period.  Your baby has less than 3 stools in a 24-hour period.  Your baby's skin or the white part of his or her eyes becomes yellow.   Your baby is not gaining weight by 5 days of age. SEEK IMMEDIATE MEDICAL CARE IF:   Your baby is overly tired (lethargic) and does not want to wake up and feed.  Your baby develops an unexplained fever. Document Released: 05/01/2005 Document Revised: 05/06/2013 Document Reviewed: 10/23/2012 ExitCare  Patient Information 2015 [employer]. This information is not intended to replace advice given to you by your health care provider. Make sure you discuss any questions you have with your health care provider.  

## 2014-08-06 NOTE — Progress Notes (Signed)
Weekly BPPs with [facility] 08/06/14 @ 315p, BPP and growth U/S 08/13/14 @ 9a.

## 2014-08-06 NOTE — Progress Notes (Signed)
   Subjective:    Kristy Miller is a G1P0 7629w2d being seen today for her first obstetrical visit.  Her obstetrical history is significant for polyhydramnios. Patient does intend to breast feed. Pregnancy history fully reviewed.  Patient reports no complaints.  Filed Vitals:   08/06/14 0907  BP: 110/60  Pulse: 81  Temp: 98.2 F (36.8 C)  Weight: 137 lb 6.4 oz (62.324 kg)    HISTORY: OB History  Gravida Para Term Preterm AB SAB TAB Ectopic Multiple Living  1             # Outcome Date GA Lbr Len/2nd Weight Sex Delivery Anes PTL Lv  1 Current              Past Medical History  Diagnosis Date  . Asthma   . Eczema    Past Surgical History  Procedure Laterality Date  . Other surgical history      age 89 , surgery on abd? not sure    Family History  Problem Relation Age of Onset  . Adopted: Yes     Exam    Nor indicated    Assessment:    Pregnancy: G1P0 Patient Active Problem List   Diagnosis Date Noted  . Supervision of high risk pregnancy, antepartum 08/06/2014  . Polyhydramnios, antepartum 08/06/2014  . Anemia 08/03/2014  . Chlamydia contact, treated 08/03/2014        Plan:     Initial labs reviewed Prenatal vitamins. Problem list reviewed and updated. Genetic Screening discussed Quad Screen: results reviewed.  Ultrasound discussed; fetal survey: follow up scheduled on 3/31.  Follow up in 2 weeks. Weekly BPP until 3/31 ultrasound 50% of 30 min visit spent on counseling and coordination of care.  1 hr GCT, labs and TDap today   Wasil Wolke 08/06/2014

## 2014-08-06 NOTE — Progress Notes (Signed)
Here for first ob visit , [facility]. C/o contractions occasionally, not every day. Given 28 week education packet.

## 2014-08-07 LAB — GLUCOSE TOLERANCE, 1 HOUR (50G) W/O FASTING: Glucose, 1 Hour GTT: 81 mg/dL (ref 70–140)

## 2014-08-07 LAB — RPR

## 2014-08-07 LAB — HIV ANTIBODY (ROUTINE TESTING W REFLEX): HIV 1&2 Ab, 4th Generation: NONREACTIVE

## 2014-08-13 ENCOUNTER — Ambulatory Visit (HOSPITAL_COMMUNITY): Payer: Medicaid Other

## 2014-08-26 ENCOUNTER — Ambulatory Visit (INDEPENDENT_AMBULATORY_CARE_PROVIDER_SITE_OTHER): Payer: Medicaid Other | Admitting: Advanced Practice Midwife

## 2014-08-26 VITALS — BP 116/66 | HR 76 | Temp 97.9°F | Wt 140.3 lb

## 2014-08-26 DIAGNOSIS — O0993 Supervision of high risk pregnancy, unspecified, third trimester: Secondary | ICD-10-CM

## 2014-08-26 LAB — POCT URINALYSIS DIP (DEVICE)
Bilirubin Urine: NEGATIVE
GLUCOSE, UA: NEGATIVE mg/dL
Hgb urine dipstick: NEGATIVE
Ketones, ur: NEGATIVE mg/dL
Nitrite: NEGATIVE
Protein, ur: 30 mg/dL — AB
SPECIFIC GRAVITY, URINE: 1.02 (ref 1.005–1.030)
UROBILINOGEN UA: 1 mg/dL (ref 0.0–1.0)
pH: 7.5 (ref 5.0–8.0)

## 2014-08-26 NOTE — Progress Notes (Signed)
Leukocytes: small 

## 2014-08-26 NOTE — Patient Instructions (Signed)

## 2014-08-26 NOTE — Progress Notes (Signed)
Poly resolved at US 3/24, but MFM recs growth US 3 weeks (tomorrow)

## 2014-08-27 ENCOUNTER — Other Ambulatory Visit (HOSPITAL_COMMUNITY): Payer: Self-pay | Admitting: Obstetrics and Gynecology

## 2014-08-27 ENCOUNTER — Other Ambulatory Visit (HOSPITAL_COMMUNITY): Payer: Self-pay | Admitting: Maternal and Fetal Medicine

## 2014-08-27 ENCOUNTER — Ambulatory Visit (HOSPITAL_COMMUNITY)
Admission: RE | Admit: 2014-08-27 | Discharge: 2014-08-27 | Disposition: A | Discharge status: Home / Self Care | Payer: Medicaid Other | Source: Ambulatory Visit | Attending: Obstetrics and Gynecology | Admitting: Obstetrics and Gynecology

## 2014-08-27 ENCOUNTER — Encounter (HOSPITAL_COMMUNITY): Payer: Self-pay

## 2014-08-27 VITALS — BP 114/80 | HR 92

## 2014-08-27 DIAGNOSIS — O403XX0 Polyhydramnios, third trimester, not applicable or unspecified: Secondary | ICD-10-CM | POA: Diagnosis present

## 2014-08-27 DIAGNOSIS — O403XX1 Polyhydramnios, third trimester, fetus 1: Secondary | ICD-10-CM

## 2014-08-27 DIAGNOSIS — O409XX0 Polyhydramnios, unspecified trimester, not applicable or unspecified: Secondary | ICD-10-CM

## 2014-08-27 DIAGNOSIS — O0993 Supervision of high risk pregnancy, unspecified, third trimester: Secondary | ICD-10-CM

## 2014-08-27 DIAGNOSIS — Z3A31 31 weeks gestation of pregnancy: Secondary | ICD-10-CM | POA: Insufficient documentation

## 2014-08-27 DIAGNOSIS — O409XX9 Polyhydramnios, unspecified trimester, other fetus: Secondary | ICD-10-CM

## 2014-09-09 ENCOUNTER — Ambulatory Visit (INDEPENDENT_AMBULATORY_CARE_PROVIDER_SITE_OTHER): Payer: Medicaid Other | Admitting: Family

## 2014-09-09 VITALS — BP 133/75 | HR 81 | Temp 98.4°F | Wt 141.1 lb

## 2014-09-09 DIAGNOSIS — O403XX1 Polyhydramnios, third trimester, fetus 1: Secondary | ICD-10-CM

## 2014-09-09 DIAGNOSIS — O403XX0 Polyhydramnios, third trimester, not applicable or unspecified: Secondary | ICD-10-CM

## 2014-09-09 DIAGNOSIS — O0993 Supervision of high risk pregnancy, unspecified, third trimester: Secondary | ICD-10-CM

## 2014-09-09 LAB — POCT URINALYSIS DIP (DEVICE)
Bilirubin Urine: NEGATIVE
Glucose, UA: NEGATIVE mg/dL
Hgb urine dipstick: NEGATIVE
Ketones, ur: NEGATIVE mg/dL
Nitrite: NEGATIVE
PH: 7.5 (ref 5.0–8.0)
PROTEIN: 30 mg/dL — AB
SPECIFIC GRAVITY, URINE: 1.015 (ref 1.005–1.030)
Urobilinogen, UA: 1 mg/dL (ref 0.0–1.0)

## 2014-09-09 NOTE — Progress Notes (Signed)
Doing well.  Polyhydramnios resolved; next ultrasound on 09/24/14 to reassess growth.

## 2014-09-09 NOTE — Progress Notes (Signed)
Why Should I Breastfeed? And How Father's Can Support Breastfeeding education sheets given to patient.

## 2014-09-23 ENCOUNTER — Encounter: Payer: Self-pay | Admitting: Physician Assistant

## 2014-09-23 ENCOUNTER — Ambulatory Visit (INDEPENDENT_AMBULATORY_CARE_PROVIDER_SITE_OTHER): Payer: Medicaid Other | Admitting: Physician Assistant

## 2014-09-23 VITALS — BP 114/83 | HR 86 | Temp 98.4°F | Wt 144.5 lb

## 2014-09-23 DIAGNOSIS — O0993 Supervision of high risk pregnancy, unspecified, third trimester: Secondary | ICD-10-CM

## 2014-09-23 LAB — POCT URINALYSIS DIP (DEVICE)
BILIRUBIN URINE: NEGATIVE
GLUCOSE, UA: NEGATIVE mg/dL
Hgb urine dipstick: NEGATIVE
Ketones, ur: NEGATIVE mg/dL
NITRITE: NEGATIVE
PH: 6.5 (ref 5.0–8.0)
Protein, ur: NEGATIVE mg/dL
Specific Gravity, Urine: 1.02 (ref 1.005–1.030)
Urobilinogen, UA: 1 mg/dL (ref 0.0–1.0)

## 2014-09-23 NOTE — Progress Notes (Signed)
Pt is concerned about pain when at work

## 2014-09-23 NOTE — Progress Notes (Signed)
35 weeks, has u/s scheduled for tomorrow to eval fluid (prior poly).  Feeling sore in inner thighs bilaterally.  Endorses good fetal movement.  Denies LOF, VB, dysuria.   Labor precautions RTC 1 week for 36 week visit.

## 2014-09-23 NOTE — Patient Instructions (Signed)
Pain Relief During Labor and Delivery Everyone experiences pain differently, but labor causes severe pain for many women. The amount of pain you experience during labor and delivery depends on your pain tolerance, contraction strength, and your baby's size and position. There are many ways to prepare for and deal with the pain, including:   Taking prenatal classes to learn about labor and delivery. The more informed you are, the less anxious and afraid you may be. This can help lessen the pain.  Taking pain-relieving medicine during labor and delivery.  Learning breathing and relaxation techniques.  Taking a shower or bath.  Getting massaged.  Changing positions.  Placing an ice pack on your back. Discuss your pain control options with your health care provider during your prenatal visits.  WHAT ARE THE TWO TYPES OF PAIN-RELIEVING MEDICINES? 1. Analgesics. These are medicines that decrease pain without total loss of feeling or muscle movement. 2. Anesthetics. These are medicines that block all feeling, including pain. There can be minor side effects of both types, such as nausea, trouble concentrating, becoming sleepy, and lowering the heart rate of the baby. However, health care providers are careful to give doses that will not seriously affect the baby.  WHAT ARE THE SPECIFIC TYPES OF ANALGESICS AND ANESTHETICS? Systemic Analgesic Systemic pain medicines affect your whole body rather than focusing pain relief on the area of your body experiencing pain. This type of medicine is given either through an IV tube in your vein or by a shot (injection) into your muscle. This medicine will lessen your pain but will not stop it completely. It may also make you sleepy, but it will not make you lose consciousness.  Local Anesthetic Local anesthetic isused tonumb a small area of your body. The medicine is injected into the area of nerves that carry feeling to the vagina, vulva, or the area between  the vagina and anus (perineum).  General Anesthetic This type of medicine causes you to lose consciousness so you do not feel pain. It is usually used only in emergency situations during labor. It is given through an IV tube or face mask. Paracervical Block A paracervical block is a form of local anesthesia given during labor. Numbing medicine is injected into the right and left sides of the cervix and vagina. It helps to lessen the pain caused by contractions and stretching of the cervix. It may have to be given more than once.  Pudendal Block A pudendal block is another form of local anesthesia. It is used to relieve the pain associated with pushing or stretching of the perineum at the time of delivery. An injection is given deep through the vaginal wall into the pudendal nerve in the pelvis, numbing the perineum.  Epidural Anesthetic An epidural is an injection of numbing medicine given in the lower back and into the epidural space near your spinal cord. The epidural numbs the lower half of your body. You may be able to move your legs but will not be allowed to walk. Epidurals can be used for labor, delivery, or cesarean deliveries.  To prevent the medicine from wearing off, a small tube (catheter) may be threaded into the epidural space and taped in place to prevent it from slipping out. Medicine can then be given continuously in small doses through the tube until you deliver. Spinal Block A spinal block is similar to an epidural, but the medicine is injected into the spinal fluid, not the epidural space. A spinal block is only given   once. It starts to relieve pain quickly but lasts only 1-2 hours. Spinal blocks can also be used for cesarean deliveries.  Combined Spinal-Epidural Block Combined spinal-epidural blocks combine the benefits of both the spinal and epidural blocks. The spinal part acts quickly to relieve pain and the epidural provides continuous pain relief. Hydrotherapy Immersion in  warm water during labor may provide comfort and relaxation. It may also help to lessen pain, the use of anesthesia, and the length of labor. However, immersion in water during the delivery (water birth) may have some risk involved and studies to determine safety and risks are ongoing. If you are a healthy woman who is expecting an uncomplicated birth, talk with your health care provider to see if water birth is an option for you.  Document Released: 08/17/2008 Document Revised: 05/06/2013 Document Reviewed: 09/19/2012 ExitCare Patient Information 2015 [employer]. This information is not intended to replace advice given to you by your health care provider. Make sure you discuss any questions you have with your health care provider.  

## 2014-09-24 ENCOUNTER — Ambulatory Visit (HOSPITAL_COMMUNITY)
Admission: RE | Admit: 2014-09-24 | Discharge: 2014-09-24 | Disposition: A | Discharge status: Home / Self Care | Payer: Medicaid Other | Source: Ambulatory Visit | Attending: Physician Assistant | Admitting: Physician Assistant

## 2014-09-24 ENCOUNTER — Encounter (HOSPITAL_COMMUNITY): Payer: Self-pay

## 2014-09-24 DIAGNOSIS — Z3A35 35 weeks gestation of pregnancy: Secondary | ICD-10-CM | POA: Insufficient documentation

## 2014-09-24 DIAGNOSIS — O409XX0 Polyhydramnios, unspecified trimester, not applicable or unspecified: Secondary | ICD-10-CM

## 2014-09-24 DIAGNOSIS — O403XX0 Polyhydramnios, third trimester, not applicable or unspecified: Secondary | ICD-10-CM | POA: Diagnosis not present

## 2014-09-30 ENCOUNTER — Ambulatory Visit (INDEPENDENT_AMBULATORY_CARE_PROVIDER_SITE_OTHER): Payer: Medicaid Other | Admitting: Certified Nurse Midwife

## 2014-09-30 ENCOUNTER — Encounter: Payer: Self-pay | Admitting: Certified Nurse Midwife

## 2014-09-30 VITALS — BP 121/73 | HR 62 | Temp 98.0°F | Wt 144.5 lb

## 2014-09-30 DIAGNOSIS — Z3493 Encounter for supervision of normal pregnancy, unspecified, third trimester: Secondary | ICD-10-CM

## 2014-09-30 LAB — POCT URINALYSIS DIP (DEVICE)
Bilirubin Urine: NEGATIVE
Glucose, UA: NEGATIVE mg/dL
Hgb urine dipstick: NEGATIVE
Ketones, ur: NEGATIVE mg/dL
Nitrite: NEGATIVE
Protein, ur: NEGATIVE mg/dL
Specific Gravity, Urine: 1.015 (ref 1.005–1.030)
Urobilinogen, UA: 1 mg/dL (ref 0.0–1.0)
pH: 7 (ref 5.0–8.0)

## 2014-09-30 LAB — OB RESULTS CONSOLE GC/CHLAMYDIA
Chlamydia: NEGATIVE
Gonorrhea: NEGATIVE

## 2014-09-30 LAB — OB RESULTS CONSOLE GBS: STREP GROUP B AG: NEGATIVE

## 2014-09-30 NOTE — Patient Instructions (Signed)
Third Trimester of Pregnancy The third trimester is from week 29 through week 42, months 7 through 9. The third trimester is a time when the fetus is growing rapidly. At the end of the ninth month, the fetus is about 20 inches in length and weighs 6-10 pounds.  BODY CHANGES Your body goes through many changes during pregnancy. The changes vary from woman to woman.   Your weight will continue to increase. You can expect to gain 25-35 pounds (11-16 kg) by the end of the pregnancy.  You may begin to get stretch marks on your hips, abdomen, and breasts.  You may urinate more often because the fetus is moving lower into your pelvis and pressing on your bladder.  You may develop or continue to have heartburn as a result of your pregnancy.  You may develop constipation because certain hormones are causing the muscles that push waste through your intestines to slow down.  You may develop hemorrhoids or swollen, bulging veins (varicose veins).  You may have pelvic pain because of the weight gain and pregnancy hormones relaxing your joints between the bones in your pelvis. Backaches may result from overexertion of the muscles supporting your posture.  You may have changes in your hair. These can include thickening of your hair, rapid growth, and changes in texture. Some women also have hair loss during or after pregnancy, or hair that feels dry or thin. Your hair will most likely return to normal after your baby is born.  Your breasts will continue to grow and be tender. A yellow discharge may leak from your breasts called colostrum.  Your belly button may stick out.  You may feel short of breath because of your expanding uterus.  You may notice the fetus "dropping," or moving lower in your abdomen.  You may have a bloody mucus discharge. This usually occurs a few days to a week before labor begins.  Your cervix becomes thin and soft (effaced) near your due date. WHAT TO EXPECT AT YOUR PRENATAL  EXAMS  You will have prenatal exams every 2 weeks until week 36. Then, you will have weekly prenatal exams. During a routine prenatal visit:  You will be weighed to make sure you and the fetus are growing normally.  Your blood pressure is taken.  Your abdomen will be measured to track your baby's growth.  The fetal heartbeat will be listened to.  Any test results from the previous visit will be discussed.  You may have a cervical check near your due date to see if you have effaced. At around 36 weeks, your caregiver will check your cervix. At the same time, your caregiver will also perform a test on the secretions of the vaginal tissue. This test is to determine if a type of bacteria, Group B streptococcus, is present. Your caregiver will explain this further. Your caregiver may ask you:  What your birth plan is.  How you are feeling.  If you are feeling the baby move.  If you have had any abnormal symptoms, such as leaking fluid, bleeding, severe headaches, or abdominal cramping.  If you have any questions. Other tests or screenings that may be performed during your third trimester include:  Blood tests that check for low iron levels (anemia).  Fetal testing to check the health, activity level, and growth of the fetus. Testing is done if you have certain medical conditions or if there are problems during the pregnancy. FALSE LABOR You may feel small, irregular contractions that   eventually go away. These are called Braxton Hicks contractions, or false labor. Contractions may last for hours, days, or even weeks before true labor sets in. If contractions come at regular intervals, intensify, or become painful, it is best to be seen by your caregiver.  SIGNS OF LABOR   Menstrual-like cramps.  Contractions that are 5 minutes apart or less.  Contractions that start on the top of the uterus and spread down to the lower abdomen and back.  A sense of increased pelvic pressure or back  pain.  A watery or bloody mucus discharge that comes from the vagina. If you have any of these signs before the 37th week of pregnancy, call your caregiver right away. You need to go to the hospital to get checked immediately. HOME CARE INSTRUCTIONS   Avoid all smoking, herbs, alcohol, and unprescribed drugs. These chemicals affect the formation and growth of the baby.  Follow your caregiver's instructions regarding medicine use. There are medicines that are either safe or unsafe to take during pregnancy.  Exercise only as directed by your caregiver. Experiencing uterine cramps is a good sign to stop exercising.  Continue to eat regular, healthy meals.  Wear a good support bra for breast tenderness.  Do not use hot tubs, steam rooms, or saunas.  Wear your seat belt at all times when driving.  Avoid raw meat, uncooked cheese, cat litter boxes, and soil used by cats. These carry germs that can cause birth defects in the baby.  Take your prenatal vitamins.  Try taking a stool softener (if your caregiver approves) if you develop constipation. Eat more high-fiber foods, such as fresh vegetables or fruit and whole grains. Drink plenty of fluids to keep your urine clear or pale yellow.  Take warm sitz baths to soothe any pain or discomfort caused by hemorrhoids. Use hemorrhoid cream if your caregiver approves.  If you develop varicose veins, wear support hose. Elevate your feet for 15 minutes, 3-4 times a day. Limit salt in your diet.  Avoid heavy lifting, wear low heal shoes, and practice good posture.  Rest a lot with your legs elevated if you have leg cramps or low back pain.  Visit your dentist if you have not gone during your pregnancy. Use a soft toothbrush to brush your teeth and be gentle when you floss.  A sexual relationship may be continued unless your caregiver directs you otherwise.  Do not travel far distances unless it is absolutely necessary and only with the approval  of your caregiver.  Take prenatal classes to understand, practice, and ask questions about the labor and delivery.  Make a trial run to the hospital.  Pack your hospital bag.  Prepare the baby's nursery.  Continue to go to all your prenatal visits as directed by your caregiver. SEEK MEDICAL CARE IF:  You are unsure if you are in labor or if your water has broken.  You have dizziness.  You have mild pelvic cramps, pelvic pressure, or nagging pain in your abdominal area.  You have persistent nausea, vomiting, or diarrhea.  You have a bad smelling vaginal discharge.  You have pain with urination. SEEK IMMEDIATE MEDICAL CARE IF:   You have a fever.  You are leaking fluid from your vagina.  You have spotting or bleeding from your vagina.  You have severe abdominal cramping or pain.  You have rapid weight loss or gain.  You have shortness of breath with chest pain.  You notice sudden or extreme swelling   of your face, hands, ankles, feet, or legs.  You have not felt your baby move in over an hour.  You have severe headaches that do not go away with medicine.  You have vision changes. Document Released: 04/25/2001 Document Revised: 05/06/2013 Document Reviewed: 07/02/2012 ExitCare Patient Information 2015 [employer]. This information is not intended to replace advice given to you by your health care provider. Make sure you discuss any questions you have with your health care provider.  

## 2014-09-30 NOTE — Progress Notes (Signed)
Polyhydramnios resolved. FH appropriate. Last u/s revealed several PAC's. Recommend no further testing unless fetal tachycardia is found while doing fht's. No fetal tachycardia noted today.GBS/GC/CHL culture done today.

## 2014-10-01 LAB — GC/CHLAMYDIA PROBE AMP
CT Probe RNA: NEGATIVE
GC Probe RNA: NEGATIVE

## 2014-10-02 LAB — CULTURE, BETA STREP (GROUP B ONLY)

## 2014-10-07 ENCOUNTER — Ambulatory Visit (INDEPENDENT_AMBULATORY_CARE_PROVIDER_SITE_OTHER): Payer: Medicaid Other | Admitting: Certified Nurse Midwife

## 2014-10-07 VITALS — BP 132/77 | HR 72 | Wt 146.8 lb

## 2014-10-07 DIAGNOSIS — O0993 Supervision of high risk pregnancy, unspecified, third trimester: Secondary | ICD-10-CM

## 2014-10-07 LAB — POCT URINALYSIS DIP (DEVICE)
Glucose, UA: NEGATIVE mg/dL
Hgb urine dipstick: NEGATIVE
Ketones, ur: NEGATIVE mg/dL
Nitrite: NEGATIVE
Protein, ur: 30 mg/dL — AB
Specific Gravity, Urine: >=1.03 (ref 1.005–1.030)
Urobilinogen, UA: 1 mg/dL (ref 0.0–1.0)
pH: 6 (ref 5.0–8.0)

## 2014-10-07 NOTE — Patient Instructions (Signed)
Third Trimester of Pregnancy The third trimester is from week 29 through week 42, months 7 through 9. This trimester is when your unborn baby (fetus) is growing very fast. At the end of the ninth month, the unborn baby is about 20 inches in length. It weighs about 6-10 pounds.  HOME CARE   Avoid all smoking, herbs, and alcohol. Avoid drugs not approved by your doctor.  Only take medicine as told by your doctor. Some medicines are safe and some are not during pregnancy.  Exercise only as told by your doctor. Stop exercising if you start having cramps.  Eat regular, healthy meals.  Wear a good support bra if your breasts are tender.  Do not use hot tubs, steam rooms, or saunas.  Wear your seat belt when driving.  Avoid raw meat, uncooked cheese, and liter boxes and soil used by cats.  Take your prenatal vitamins.  Try taking medicine that helps you poop (stool softener) as needed, and if your doctor approves. Eat more fiber by eating fresh fruit, vegetables, and whole grains. Drink enough fluids to keep your pee (urine) clear or pale yellow.  Take warm water baths (sitz baths) to soothe pain or discomfort caused by hemorrhoids. Use hemorrhoid cream if your doctor approves.  If you have puffy, bulging veins (varicose veins), wear support hose. Raise (elevate) your feet for 15 minutes, 3-4 times a day. Limit salt in your diet.  Avoid heavy lifting, wear low heels, and sit up straight.  Rest with your legs raised if you have leg cramps or low back pain.  Visit your dentist if you have not gone during your pregnancy. Use a soft toothbrush to brush your teeth. Be gentle when you floss.  You can have sex (intercourse) unless your doctor tells you not to.  Do not travel far distances unless you must. Only do so with your doctor's approval.  Take prenatal classes.  Practice driving to the hospital.  Pack your hospital bag.  Prepare the baby's room.  Go to your doctor visits. GET  HELP IF:  You are not sure if you are in labor or if your water has broken.  You are dizzy.  You have mild cramps or pressure in your lower belly (abdominal).  You have a nagging pain in your belly area.  You continue to feel sick to your stomach (nauseous), throw up (vomit), or have watery poop (diarrhea).  You have bad smelling fluid coming from your vagina.  You have pain with peeing (urination). GET HELP RIGHT AWAY IF:   You have a fever.  You are leaking fluid from your vagina.  You are spotting or bleeding from your vagina.  You have severe belly cramping or pain.  You lose or gain weight rapidly.  You have trouble catching your breath and have chest pain.  You notice sudden or extreme puffiness (swelling) of your face, hands, ankles, feet, or legs.  You have not felt the baby move in over an hour.  You have severe headaches that do not go away with medicine.  You have vision changes. Document Released: 07/26/2009 Document Revised: 08/26/2012 Document Reviewed: 07/02/2012 ExitCare Patient Information 2015 [employer]. This information is not intended to replace advice given to you by your health care provider. Make sure you discuss any questions you have with your health care provider.  

## 2014-10-07 NOTE — Progress Notes (Signed)
Doing well. Only having a few contractions. Deferred vaginal exam today. GBS negative. GC/Ch cx's negative

## 2014-10-14 ENCOUNTER — Encounter: Payer: Self-pay | Admitting: Physician Assistant

## 2014-10-14 ENCOUNTER — Ambulatory Visit (INDEPENDENT_AMBULATORY_CARE_PROVIDER_SITE_OTHER): Payer: Medicaid Other | Admitting: Physician Assistant

## 2014-10-14 VITALS — BP 137/83 | HR 73 | Wt 146.8 lb

## 2014-10-14 DIAGNOSIS — O471 False labor at or after 37 completed weeks of gestation: Secondary | ICD-10-CM | POA: Diagnosis not present

## 2014-10-14 DIAGNOSIS — O26843 Uterine size-date discrepancy, third trimester: Secondary | ICD-10-CM

## 2014-10-14 LAB — POCT URINALYSIS DIP (DEVICE)
Bilirubin Urine: NEGATIVE
Glucose, UA: NEGATIVE mg/dL
Ketones, ur: NEGATIVE mg/dL
NITRITE: NEGATIVE
PH: 7 (ref 5.0–8.0)
Protein, ur: 30 mg/dL — AB
SPECIFIC GRAVITY, URINE: 1.025 (ref 1.005–1.030)
UROBILINOGEN UA: 1 mg/dL (ref 0.0–1.0)

## 2014-10-14 NOTE — Patient Instructions (Signed)
Pain Relief During Labor and Delivery Everyone experiences pain differently, but labor causes severe pain for many women. The amount of pain you experience during labor and delivery depends on your pain tolerance, contraction strength, and your baby's size and position. There are many ways to prepare for and deal with the pain, including:   Taking prenatal classes to learn about labor and delivery. The more informed you are, the less anxious and afraid you may be. This can help lessen the pain.  Taking pain-relieving medicine during labor and delivery.  Learning breathing and relaxation techniques.  Taking a shower or bath.  Getting massaged.  Changing positions.  Placing an ice pack on your back. Discuss your pain control options with your health care provider during your prenatal visits.  WHAT ARE THE TWO TYPES OF PAIN-RELIEVING MEDICINES? 1. Analgesics. These are medicines that decrease pain without total loss of feeling or muscle movement. 2. Anesthetics. These are medicines that block all feeling, including pain. There can be minor side effects of both types, such as nausea, trouble concentrating, becoming sleepy, and lowering the heart rate of the baby. However, health care providers are careful to give doses that will not seriously affect the baby.  WHAT ARE THE SPECIFIC TYPES OF ANALGESICS AND ANESTHETICS? Systemic Analgesic Systemic pain medicines affect your whole body rather than focusing pain relief on the area of your body experiencing pain. This type of medicine is given either through an IV tube in your vein or by a shot (injection) into your muscle. This medicine will lessen your pain but will not stop it completely. It may also make you sleepy, but it will not make you lose consciousness.  Local Anesthetic Local anesthetic isused tonumb a small area of your body. The medicine is injected into the area of nerves that carry feeling to the vagina, vulva, or the area between  the vagina and anus (perineum).  General Anesthetic This type of medicine causes you to lose consciousness so you do not feel pain. It is usually used only in emergency situations during labor. It is given through an IV tube or face mask. Paracervical Block A paracervical block is a form of local anesthesia given during labor. Numbing medicine is injected into the right and left sides of the cervix and vagina. It helps to lessen the pain caused by contractions and stretching of the cervix. It may have to be given more than once.  Pudendal Block A pudendal block is another form of local anesthesia. It is used to relieve the pain associated with pushing or stretching of the perineum at the time of delivery. An injection is given deep through the vaginal wall into the pudendal nerve in the pelvis, numbing the perineum.  Epidural Anesthetic An epidural is an injection of numbing medicine given in the lower back and into the epidural space near your spinal cord. The epidural numbs the lower half of your body. You may be able to move your legs but will not be allowed to walk. Epidurals can be used for labor, delivery, or cesarean deliveries.  To prevent the medicine from wearing off, a small tube (catheter) may be threaded into the epidural space and taped in place to prevent it from slipping out. Medicine can then be given continuously in small doses through the tube until you deliver. Spinal Block A spinal block is similar to an epidural, but the medicine is injected into the spinal fluid, not the epidural space. A spinal block is only given   once. It starts to relieve pain quickly but lasts only 1-2 hours. Spinal blocks can also be used for cesarean deliveries.  Combined Spinal-Epidural Block Combined spinal-epidural blocks combine the benefits of both the spinal and epidural blocks. The spinal part acts quickly to relieve pain and the epidural provides continuous pain relief. Hydrotherapy Immersion in  warm water during labor may provide comfort and relaxation. It may also help to lessen pain, the use of anesthesia, and the length of labor. However, immersion in water during the delivery (water birth) may have some risk involved and studies to determine safety and risks are ongoing. If you are a healthy woman who is expecting an uncomplicated birth, talk with your health care provider to see if water birth is an option for you.  Document Released: 08/17/2008 Document Revised: 05/06/2013 Document Reviewed: 09/19/2012 ExitCare Patient Information 2015 [employer]. This information is not intended to replace advice given to you by your health care provider. Make sure you discuss any questions you have with your health care provider.  

## 2014-10-14 NOTE — Progress Notes (Signed)
38 weeks with sporadic Braxton Hicks contractions.  Pt reports good fetal movement and denies LOF,VB, dysuria.   Measuring SGA.  Growth u/s on 5/12 was good with EFW  78%.   Pt to have in-office AFI today.  Per Diane - meets criteria for acceptable AFI but there are limited pockets.   AND - pt informs Diane she may have been having sporadic leaking of fluid over the last 2 weeks. Spec inserted.  White physiologic discharge noted.  NO pooling of liquid.  NO fluid from cervix with valsalva.   Pt strongly advised to come in to [facility]U asap with questionable LOF.  Risks discussed. She expresses understanding.   RTC 1 week

## 2014-10-14 NOTE — Progress Notes (Signed)
Has not yet left urine sample.   [id number]: Trace hgb and small leuks in urine.

## 2014-10-22 ENCOUNTER — Ambulatory Visit (INDEPENDENT_AMBULATORY_CARE_PROVIDER_SITE_OTHER): Payer: Medicaid Other | Admitting: Family

## 2014-10-22 VITALS — BP 126/80 | HR 80 | Temp 97.7°F | Wt 144.7 lb

## 2014-10-22 DIAGNOSIS — Z3493 Encounter for supervision of normal pregnancy, unspecified, third trimester: Secondary | ICD-10-CM

## 2014-10-22 DIAGNOSIS — O403XX0 Polyhydramnios, third trimester, not applicable or unspecified: Secondary | ICD-10-CM

## 2014-10-22 DIAGNOSIS — O403XX1 Polyhydramnios, third trimester, fetus 1: Secondary | ICD-10-CM

## 2014-10-22 DIAGNOSIS — O368390 Maternal care for abnormalities of the fetal heart rate or rhythm, unspecified trimester, not applicable or unspecified: Secondary | ICD-10-CM | POA: Insufficient documentation

## 2014-10-22 NOTE — Progress Notes (Signed)
Doing well; no questions or concerns. Reviewed signs of labor and IOL at 41 wks if not delivered.

## 2014-10-22 NOTE — Progress Notes (Signed)
Small leuks and trace protein noted in urine.

## 2014-10-22 NOTE — Progress Notes (Signed)
Reviewed tip of week with patient  

## 2014-10-23 LAB — POCT URINALYSIS DIP (DEVICE)
BILIRUBIN URINE: NEGATIVE
GLUCOSE, UA: NEGATIVE mg/dL
HGB URINE DIPSTICK: NEGATIVE
Ketones, ur: NEGATIVE mg/dL
Nitrite: NEGATIVE
PROTEIN: NEGATIVE mg/dL
Specific Gravity, Urine: 1.02 (ref 1.005–1.030)
Urobilinogen, UA: 1 mg/dL (ref 0.0–1.0)
pH: 6.5 (ref 5.0–8.0)

## 2014-10-28 ENCOUNTER — Ambulatory Visit (INDEPENDENT_AMBULATORY_CARE_PROVIDER_SITE_OTHER): Payer: Medicaid Other | Admitting: Physician Assistant

## 2014-10-28 ENCOUNTER — Telehealth (HOSPITAL_COMMUNITY): Payer: Self-pay | Admitting: *Deleted

## 2014-10-28 VITALS — BP 127/89 | HR 79 | Temp 98.5°F | Wt 146.4 lb

## 2014-10-28 DIAGNOSIS — O403XX0 Polyhydramnios, third trimester, not applicable or unspecified: Secondary | ICD-10-CM | POA: Diagnosis not present

## 2014-10-28 DIAGNOSIS — O26843 Uterine size-date discrepancy, third trimester: Secondary | ICD-10-CM

## 2014-10-28 DIAGNOSIS — O480 Post-term pregnancy: Secondary | ICD-10-CM | POA: Diagnosis present

## 2014-10-28 DIAGNOSIS — Z3493 Encounter for supervision of normal pregnancy, unspecified, third trimester: Secondary | ICD-10-CM | POA: Diagnosis not present

## 2014-10-28 NOTE — Progress Notes (Signed)
[place] for growth and BPP scheduled 6/17 @ 0815.  IOL on 6/22 @ 0001.

## 2014-10-28 NOTE — Telephone Encounter (Signed)
Preadmission screen  

## 2014-10-28 NOTE — Progress Notes (Signed)
No VB, no LOF, irregular contractions. Good fetal movement.

## 2014-10-28 NOTE — Progress Notes (Signed)
Pain- generlized pain and in the groin area

## 2014-10-28 NOTE — Progress Notes (Signed)
40 weeks, no complaints.  Good fetal movement, denies LOF, VB, dysuria.   I measured her at 36 weeks.  Will order BPP for later this week to assess.   NST today.  Sched IOL and twice weekly testing.

## 2014-10-28 NOTE — Patient Instructions (Signed)
Pain Relief During Labor and Delivery Everyone experiences pain differently, but labor causes severe pain for many women. The amount of pain you experience during labor and delivery depends on your pain tolerance, contraction strength, and your baby's size and position. There are many ways to prepare for and deal with the pain, including:   Taking prenatal classes to learn about labor and delivery. The more informed you are, the less anxious and afraid you may be. This can help lessen the pain.  Taking pain-relieving medicine during labor and delivery.  Learning breathing and relaxation techniques.  Taking a shower or bath.  Getting massaged.  Changing positions.  Placing an ice pack on your back. Discuss your pain control options with your health care provider during your prenatal visits.  WHAT ARE THE TWO TYPES OF PAIN-RELIEVING MEDICINES? 1. Analgesics. These are medicines that decrease pain without total loss of feeling or muscle movement. 2. Anesthetics. These are medicines that block all feeling, including pain. There can be minor side effects of both types, such as nausea, trouble concentrating, becoming sleepy, and lowering the heart rate of the baby. However, health care providers are careful to give doses that will not seriously affect the baby.  WHAT ARE THE SPECIFIC TYPES OF ANALGESICS AND ANESTHETICS? Systemic Analgesic Systemic pain medicines affect your whole body rather than focusing pain relief on the area of your body experiencing pain. This type of medicine is given either through an IV tube in your vein or by a shot (injection) into your muscle. This medicine will lessen your pain but will not stop it completely. It may also make you sleepy, but it will not make you lose consciousness.  Local Anesthetic Local anesthetic isused tonumb a small area of your body. The medicine is injected into the area of nerves that carry feeling to the vagina, vulva, or the area between  the vagina and anus (perineum).  General Anesthetic This type of medicine causes you to lose consciousness so you do not feel pain. It is usually used only in emergency situations during labor. It is given through an IV tube or face mask. Paracervical Block A paracervical block is a form of local anesthesia given during labor. Numbing medicine is injected into the right and left sides of the cervix and vagina. It helps to lessen the pain caused by contractions and stretching of the cervix. It may have to be given more than once.  Pudendal Block A pudendal block is another form of local anesthesia. It is used to relieve the pain associated with pushing or stretching of the perineum at the time of delivery. An injection is given deep through the vaginal wall into the pudendal nerve in the pelvis, numbing the perineum.  Epidural Anesthetic An epidural is an injection of numbing medicine given in the lower back and into the epidural space near your spinal cord. The epidural numbs the lower half of your body. You may be able to move your legs but will not be allowed to walk. Epidurals can be used for labor, delivery, or cesarean deliveries.  To prevent the medicine from wearing off, a small tube (catheter) may be threaded into the epidural space and taped in place to prevent it from slipping out. Medicine can then be given continuously in small doses through the tube until you deliver. Spinal Block A spinal block is similar to an epidural, but the medicine is injected into the spinal fluid, not the epidural space. A spinal block is only given   once. It starts to relieve pain quickly but lasts only 1-2 hours. Spinal blocks can also be used for cesarean deliveries.  Combined Spinal-Epidural Block Combined spinal-epidural blocks combine the benefits of both the spinal and epidural blocks. The spinal part acts quickly to relieve pain and the epidural provides continuous pain relief. Hydrotherapy Immersion in  warm water during labor may provide comfort and relaxation. It may also help to lessen pain, the use of anesthesia, and the length of labor. However, immersion in water during the delivery (water birth) may have some risk involved and studies to determine safety and risks are ongoing. If you are a healthy woman who is expecting an uncomplicated birth, talk with your health care provider to see if water birth is an option for you.  Document Released: 08/17/2008 Document Revised: 05/06/2013 Document Reviewed: 09/19/2012 ExitCare Patient Information 2015 [employer]. This information is not intended to replace advice given to you by your health care provider. Make sure you discuss any questions you have with your health care provider.  

## 2014-10-30 ENCOUNTER — Inpatient Hospital Stay (HOSPITAL_COMMUNITY)
Admission: AD | Admit: 2014-10-30 | Discharge: 2014-11-02 | DRG: 774 | Disposition: A | Discharge status: Home / Self Care | Payer: Medicaid Other | Source: Ambulatory Visit | Attending: Obstetrics & Gynecology | Admitting: Obstetrics & Gynecology

## 2014-10-30 ENCOUNTER — Encounter (HOSPITAL_COMMUNITY): Payer: Self-pay | Admitting: *Deleted

## 2014-10-30 ENCOUNTER — Ambulatory Visit (HOSPITAL_COMMUNITY)
Admission: RE | Admit: 2014-10-30 | Discharge: 2014-10-30 | Disposition: A | Discharge status: Home / Self Care | Payer: Medicaid Other | Source: Ambulatory Visit | Attending: Physician Assistant | Admitting: Physician Assistant

## 2014-10-30 DIAGNOSIS — Z3A40 40 weeks gestation of pregnancy: Secondary | ICD-10-CM | POA: Diagnosis not present

## 2014-10-30 DIAGNOSIS — O403XX0 Polyhydramnios, third trimester, not applicable or unspecified: Secondary | ICD-10-CM | POA: Diagnosis present

## 2014-10-30 DIAGNOSIS — O0993 Supervision of high risk pregnancy, unspecified, third trimester: Secondary | ICD-10-CM

## 2014-10-30 DIAGNOSIS — O480 Post-term pregnancy: Secondary | ICD-10-CM | POA: Diagnosis present

## 2014-10-30 DIAGNOSIS — O3663X0 Maternal care for excessive fetal growth, third trimester, not applicable or unspecified: Secondary | ICD-10-CM | POA: Diagnosis present

## 2014-10-30 DIAGNOSIS — O403XX1 Polyhydramnios, third trimester, fetus 1: Secondary | ICD-10-CM

## 2014-10-30 DIAGNOSIS — O26843 Uterine size-date discrepancy, third trimester: Secondary | ICD-10-CM

## 2014-10-30 DIAGNOSIS — O133 Gestational [pregnancy-induced] hypertension without significant proteinuria, third trimester: Principal | ICD-10-CM | POA: Diagnosis present

## 2014-10-30 DIAGNOSIS — O26842 Uterine size-date discrepancy, second trimester: Secondary | ICD-10-CM | POA: Insufficient documentation

## 2014-10-30 DIAGNOSIS — O368390 Maternal care for abnormalities of the fetal heart rate or rhythm, unspecified trimester, not applicable or unspecified: Secondary | ICD-10-CM

## 2014-10-30 LAB — COMPREHENSIVE METABOLIC PANEL
ALT: 23 U/L (ref 14–54)
AST: 28 U/L (ref 15–41)
Albumin: 3.2 g/dL — ABNORMAL LOW (ref 3.5–5.0)
Alkaline Phosphatase: 160 U/L — ABNORMAL HIGH (ref 38–126)
Anion gap: 7 (ref 5–15)
BILIRUBIN TOTAL: 0.5 mg/dL (ref 0.3–1.2)
BUN: 10 mg/dL (ref 6–20)
CALCIUM: 8.8 mg/dL — AB (ref 8.9–10.3)
CHLORIDE: 105 mmol/L (ref 101–111)
CO2: 23 mmol/L (ref 22–32)
CREATININE: 0.79 mg/dL (ref 0.44–1.00)
GLUCOSE: 74 mg/dL (ref 65–99)
Potassium: 3.8 mmol/L (ref 3.5–5.1)
Sodium: 135 mmol/L (ref 135–145)
Total Protein: 6.9 g/dL (ref 6.5–8.1)

## 2014-10-30 LAB — URINALYSIS, ROUTINE W REFLEX MICROSCOPIC
Bilirubin Urine: NEGATIVE
Glucose, UA: NEGATIVE mg/dL
Hgb urine dipstick: NEGATIVE
Ketones, ur: NEGATIVE mg/dL
Nitrite: NEGATIVE
PH: 6 (ref 5.0–8.0)
Protein, ur: NEGATIVE mg/dL
SPECIFIC GRAVITY, URINE: 1.025 (ref 1.005–1.030)
Urobilinogen, UA: 0.2 mg/dL (ref 0.0–1.0)

## 2014-10-30 LAB — CBC
HCT: 34.8 % — ABNORMAL LOW (ref 36.0–46.0)
Hemoglobin: 12.3 g/dL (ref 12.0–15.0)
MCH: 31.1 pg (ref 26.0–34.0)
MCHC: 35.3 g/dL (ref 30.0–36.0)
MCV: 88.1 fL (ref 78.0–100.0)
PLATELETS: 155 10*3/uL (ref 150–400)
RBC: 3.95 MIL/uL (ref 3.87–5.11)
RDW: 13.4 % (ref 11.5–15.5)
WBC: 6 10*3/uL (ref 4.0–10.5)

## 2014-10-30 LAB — PROTEIN / CREATININE RATIO, URINE
CREATININE, URINE: 263 mg/dL
Protein Creatinine Ratio: 0.08 mg/mg{Cre} (ref 0.00–0.15)
TOTAL PROTEIN, URINE: 21 mg/dL

## 2014-10-30 LAB — TYPE AND SCREEN
ABO/RH(D): O POS
Antibody Screen: NEGATIVE

## 2014-10-30 LAB — ABO/RH: ABO/RH(D): O POS

## 2014-10-30 LAB — URINE MICROSCOPIC-ADD ON

## 2014-10-30 MED ORDER — MISOPROSTOL 25 MCG QUARTER TABLET
25.0000 ug | ORAL_TABLET | ORAL | Status: DC
  Administered 2014-10-30 (×2): 25 ug via VAGINAL
  Filled 2014-10-30: qty 0.25
  Filled 2014-10-30 (×3): qty 1
  Filled 2014-10-30 (×2): qty 0.25

## 2014-10-30 MED ORDER — ONDANSETRON HCL 4 MG/2ML IJ SOLN
4.0000 mg | Freq: Four times a day (QID) | INTRAMUSCULAR | Status: DC | PRN
  Administered 2014-10-31: 4 mg via INTRAVENOUS
  Filled 2014-10-30: qty 2

## 2014-10-30 MED ORDER — OXYCODONE-ACETAMINOPHEN 5-325 MG PO TABS: 1.0000 | ORAL_TABLET | ORAL | Status: DC | PRN

## 2014-10-30 MED ORDER — TERBUTALINE SULFATE 1 MG/ML IJ SOLN: 0.2500 mg | Freq: Once | INTRAMUSCULAR | Status: AC | PRN

## 2014-10-30 MED ORDER — LACTATED RINGERS IV SOLN
500.0000 mL | INTRAVENOUS | Status: DC | PRN
  Administered 2014-10-31: 500 mL via INTRAVENOUS

## 2014-10-30 MED ORDER — OXYTOCIN 40 UNITS IN LACTATED RINGERS INFUSION - SIMPLE MED
62.5000 mL/h | INTRAVENOUS | Status: DC
  Administered 2014-11-01: 62.5 mL/h via INTRAVENOUS
  Filled 2014-10-30: qty 1000

## 2014-10-30 MED ORDER — OXYCODONE-ACETAMINOPHEN 5-325 MG PO TABS: 2.0000 | ORAL_TABLET | ORAL | Status: DC | PRN

## 2014-10-30 MED ORDER — OXYTOCIN BOLUS FROM INFUSION: 500.0000 mL | INTRAVENOUS | Status: DC

## 2014-10-30 MED ORDER — ACETAMINOPHEN 325 MG PO TABS
650.0000 mg | ORAL_TABLET | ORAL | Status: DC | PRN
  Administered 2014-10-31 (×2): 650 mg via ORAL
  Filled 2014-10-30 (×2): qty 2

## 2014-10-30 MED ORDER — LIDOCAINE HCL (PF) 1 % IJ SOLN
30.0000 mL | INTRAMUSCULAR | Status: DC | PRN
  Filled 2014-10-30 (×2): qty 30

## 2014-10-30 MED ORDER — LACTATED RINGERS IV SOLN
INTRAVENOUS | Status: DC
  Administered 2014-10-30 – 2014-10-31 (×6): via INTRAVENOUS

## 2014-10-30 MED ORDER — CITRIC ACID-SODIUM CITRATE 334-500 MG/5ML PO SOLN: 30.0000 mL | ORAL | Status: DC | PRN

## 2014-10-30 MED ORDER — FENTANYL CITRATE (PF) 100 MCG/2ML IJ SOLN
100.0000 ug | INTRAMUSCULAR | Status: DC | PRN
  Administered 2014-10-30 – 2014-10-31 (×4): 100 ug via INTRAVENOUS
  Filled 2014-10-30 (×4): qty 2

## 2014-10-30 MED ORDER — ZOLPIDEM TARTRATE 5 MG PO TABS
5.0000 mg | ORAL_TABLET | Freq: Every evening | ORAL | Status: DC | PRN
  Administered 2014-10-31: 5 mg via ORAL
  Filled 2014-10-30 (×2): qty 1

## 2014-10-30 NOTE — ED Notes (Signed)
Pt reports a mild headache this am. No swelling noted.

## 2014-10-30 NOTE — ED Notes (Signed)
Report called to Jolynn Spurlock-Frizzell, RN.  Pt to [facility]U for evaluation. 

## 2014-10-30 NOTE — H&P (Signed)
  History   CSN: [id number]  Arrival date and time: 10/30/14 0930   HPI Patient is 19 y.o. G1P0 [redacted]w[redacted]d by 21wk scan here from [place]. She was getting her post-dates [place] this morning when she was noted to have some elevated BPs. She was sent to the MAU for evaluation. Patient endorses a mild headache that she head this morning that has since resolved. She denies any PIH symptoms.   Patient is scheduled for induction 6/22.  +FM, +contractions(~q95min, irregular), denies LOF, VB, vaginal discharge.   OB History    Gravida Para Term Preterm AB TAB SAB Ectopic Multiple Living   1             -First baby -HRC for polyhydraminos(?resolved)  Past Medical History  Diagnosis Date  . Asthma   . Eczema     Past Surgical History  Procedure Laterality Date  . Other surgical history      age 52 , surgery on abd? not sure     Family History  Problem Relation Age of Onset  . Adopted: Yes    History  Substance Use Topics  . Smoking status: Never Smoker   . Smokeless tobacco: Never Used  . Alcohol Use: No    Allergies: No Known Allergies  Prescriptions prior to admission  Medication Sig Dispense Refill Last Dose  . Prenatal Vit-Fe Fumarate-FA (MULTIVITAMIN-PRENATAL) 27-0.8 MG TABS tablet Take 1 tablet by mouth daily at 12 noon.   10/29/2014 at Unknown time    Review of Systems  Constitutional: Negative for fever and chills.  Eyes: Negative for blurred vision and double vision.  Respiratory: Negative for shortness of breath.  Cardiovascular: Negative for chest pain and leg swelling.  Gastrointestinal: Negative for nausea and vomiting.  Genitourinary: Negative for dysuria.  Neurological: Negative for dizziness and headaches.  Also per HPI  Physical Exam   Blood pressure 132/82, pulse 75, resp. rate 20, last menstrual period 02/05/2014.  Physical Exam  Constitutional: She is oriented to  person, place, and time. She appears well-developed and well-nourished. No distress.  HENT:  Head: Normocephalic and atraumatic.  Eyes: EOM are normal.  Cardiovascular: Normal rate, regular rhythm, normal heart sounds and intact distal pulses.  Respiratory: Effort normal and breath sounds normal.  GI: Soft. Bowel sounds are normal. There is no tenderness.  Genitourinary: Vaginal discharge found; cx 1/60 Musculoskeletal: Normal range of motion. She exhibits no edema or tenderness.  Neurological: She is alert and oriented to person, place, and time.  Skin: Skin is warm and dry.     MAU Course  Procedures - None  MDM: UA unremarkabkle PreE labs: CBC wnl, CMP nl, Pr:Cr 0.08 NST reassuring and reactive [place] results 6/17: AFI 8.89, EFW >90% (8+11)  Assessment and Plan  A: Patient is 19 y.o. G1P0 [redacted]w[redacted]d with:  * mildly elevated pressures today w/ initial PreE work-up that was unremarkable  * US shows pt at risk for LGA w/ EFW >90% (8+11)  * Unfavorable cx    P:  Admit to [employer] Will start ripening due to unfavorable cx Anticipate SVD Watch BPs; at this point no need for mag sulfate but will start if BPs become severe range  Cam Hai CNM 10/30/2014 1:48 PM

## 2014-10-30 NOTE — Progress Notes (Signed)
Nylayah Hawa is a 19 y.o. G1P0 at [redacted]w[redacted]d  admitted for induction of labor due to mild gHTN & at risk for LGA.  Subjective: Feeling mostly comfortable; not having painful ctx  Objective: BP 126/90 mmHg  Pulse 79  Temp(Src) 98.1 F (36.7 C) (Oral)  Resp 20  Ht 5\' 4"  (1.626 m)  Wt 66.679 kg (147 lb)  BMI 25.22 kg/m2  LMP 02/05/2014      FHT:  FHR: 130s bpm, variability: moderate,  accelerations:  Present,  decelerations:  Absent UC:   irregular, every 2-4 minutes SVE:   Dilation: 1 Effacement (%): 60 Station: -1 Exam by:: Philipp Deputy, CNM cx behind vtx which is fairly low  Labs: Lab Results  Component Value Date   WBC 6.0 10/30/2014   HGB 12.3 10/30/2014   HCT 34.8* 10/30/2014   MCV 88.1 10/30/2014   PLT 155 10/30/2014    Assessment / Plan: IUP@term  Mild gHTN At risk for LGA Cx unfavorable  Placed 2nd dose of cytotec Plan for probably 2 more doses overnight followed by foley when cx becomes more anterior  SHAW, KIMBERLY CNM 10/30/2014, 8:47 PM

## 2014-10-31 ENCOUNTER — Inpatient Hospital Stay (HOSPITAL_COMMUNITY): Payer: Medicaid Other | Admitting: Anesthesiology

## 2014-10-31 LAB — CBC
HCT: 34.4 % — ABNORMAL LOW (ref 36.0–46.0)
HEMATOCRIT: 34.7 % — AB (ref 36.0–46.0)
HEMOGLOBIN: 12.3 g/dL (ref 12.0–15.0)
Hemoglobin: 12.4 g/dL (ref 12.0–15.0)
MCH: 31.8 pg (ref 26.0–34.0)
MCH: 31.5 pg (ref 26.0–34.0)
MCHC: 36 g/dL (ref 30.0–36.0)
MCHC: 35.4 g/dL (ref 30.0–36.0)
MCV: 88.2 fL (ref 78.0–100.0)
MCV: 89 fL (ref 78.0–100.0)
PLATELETS: 154 10*3/uL (ref 150–400)
Platelets: 159 10*3/uL (ref 150–400)
RBC: 3.9 MIL/uL (ref 3.87–5.11)
RBC: 3.9 MIL/uL (ref 3.87–5.11)
RDW: 13.3 % (ref 11.5–15.5)
RDW: 13.4 % (ref 11.5–15.5)
WBC: 11.5 10*3/uL — ABNORMAL HIGH (ref 4.0–10.5)
WBC: 15.9 10*3/uL — AB (ref 4.0–10.5)

## 2014-10-31 LAB — RPR: RPR: NONREACTIVE

## 2014-10-31 LAB — HIV ANTIBODY (ROUTINE TESTING W REFLEX): HIV SCREEN 4TH GENERATION: NONREACTIVE

## 2014-10-31 MED ORDER — PHENYLEPHRINE 40 MCG/ML (10ML) SYRINGE FOR IV PUSH (FOR BLOOD PRESSURE SUPPORT)
PREFILLED_SYRINGE | INTRAVENOUS | Status: AC
  Filled 2014-10-31: qty 20

## 2014-10-31 MED ORDER — SODIUM CHLORIDE 0.9 % IV SOLN
2.0000 g | Freq: Four times a day (QID) | INTRAVENOUS | Status: DC
  Administered 2014-10-31: 2 g via INTRAVENOUS
  Filled 2014-10-31 (×4): qty 2000

## 2014-10-31 MED ORDER — BUTORPHANOL TARTRATE 2 MG/ML IJ SOLN
2.0000 mg | INTRAMUSCULAR | Status: DC | PRN
  Administered 2014-10-31: 2 mg via INTRAVENOUS
  Filled 2014-10-31: qty 2

## 2014-10-31 MED ORDER — LIDOCAINE HCL (PF) 1 % IJ SOLN
INTRAMUSCULAR | Status: DC | PRN
  Administered 2014-10-31: 3 mL
  Administered 2014-10-31 (×2): 5 mL

## 2014-10-31 MED ORDER — DIPHENHYDRAMINE HCL 50 MG/ML IJ SOLN: 12.5000 mg | INTRAMUSCULAR | Status: DC | PRN

## 2014-10-31 MED ORDER — FENTANYL 2.5 MCG/ML BUPIVACAINE 1/10 % EPIDURAL INFUSION (WH - ANES)
INTRAMUSCULAR | Status: AC
  Administered 2014-10-31: 14 mL/h via EPIDURAL
  Filled 2014-10-31: qty 125

## 2014-10-31 MED ORDER — FENTANYL 2.5 MCG/ML BUPIVACAINE 1/10 % EPIDURAL INFUSION (WH - ANES)
14.0000 mL/h | INTRAMUSCULAR | Status: DC | PRN
  Administered 2014-10-31 (×3): 14 mL/h via EPIDURAL
  Filled 2014-10-31: qty 125

## 2014-10-31 MED ORDER — PROMETHAZINE HCL 25 MG/ML IJ SOLN
12.5000 mg | INTRAMUSCULAR | Status: DC | PRN
  Administered 2014-10-31: 12.5 mg via INTRAVENOUS
  Filled 2014-10-31: qty 1

## 2014-10-31 MED ORDER — PHENYLEPHRINE 40 MCG/ML (10ML) SYRINGE FOR IV PUSH (FOR BLOOD PRESSURE SUPPORT)
80.0000 ug | PREFILLED_SYRINGE | INTRAVENOUS | Status: DC | PRN
  Filled 2014-10-31: qty 2

## 2014-10-31 MED ORDER — OXYTOCIN 40 UNITS IN LACTATED RINGERS INFUSION - SIMPLE MED
1.0000 m[IU]/min | INTRAVENOUS | Status: DC
  Administered 2014-10-31: 4 m[IU]/min via INTRAVENOUS
  Administered 2014-10-31: 2 m[IU]/min via INTRAVENOUS

## 2014-10-31 MED ORDER — GENTAMICIN SULFATE 40 MG/ML IJ SOLN
120.0000 mg | Freq: Three times a day (TID) | INTRAMUSCULAR | Status: DC
  Administered 2014-10-31: 120 mg via INTRAVENOUS
  Filled 2014-10-31 (×3): qty 3

## 2014-10-31 MED ORDER — ACETAMINOPHEN 325 MG PO TABS
325.0000 mg | ORAL_TABLET | Freq: Once | ORAL | Status: AC
  Administered 2014-10-31: 325 mg via ORAL
  Filled 2014-10-31: qty 1

## 2014-10-31 MED ORDER — TERBUTALINE SULFATE 1 MG/ML IJ SOLN: 0.2500 mg | Freq: Once | INTRAMUSCULAR | Status: AC | PRN

## 2014-10-31 MED ORDER — EPHEDRINE 5 MG/ML INJ
10.0000 mg | INTRAVENOUS | Status: DC | PRN
  Filled 2014-10-31: qty 2

## 2014-10-31 NOTE — Progress Notes (Signed)
Labor Progress Note  Kristy Miller is a 19 y.o. G1P0 at [redacted]w[redacted]d [facility].  S: Patient stating she is feeling more pressure. Had trial of pushing but not feeling the urge to push much anymore.  Now laboring down and relaxed.   O:  BP 157/95 mmHg  Pulse 109  Temp(Src) 98.2 F (36.8 C) (Oral)  Resp 18  Ht 5' 4"  (1.626 m)  Wt 147 lb (66.679 kg)  BMI 25.22 kg/m2  SpO2 100%  LMP 02/05/2014 FHT:  FHR: 140 bpm, variability: minimal ,  accelerations:  Present,  decelerations:  Present Early; variable UC:   regular, every 2-4 minutes SVE:   Dilation: 10 Effacement (%): 100 Station: +1, +2 Exam by:: DRuben ImRNC  SROM @0600 : clear  S/p cytotec x2  Labs: Lab Results  Component Value Date   WBC 11.5* 10/31/2014   HGB 12.4 10/31/2014   HCT 34.4* 10/31/2014   MCV 88.2 10/31/2014   PLT 154 10/31/2014    Assessment / Plan: 19y.o. G1P0 [id number] active labor. Doing well. Induction of labor due to gestational hypertension and and risk for LGA  Labor: Will try and labor down; patient tried pushing for about an hr without much movement.   Will recheck elevated temperature and get CBC for white count. If criteria met will start antibiotics.  gHTN: BPs starting to elevate as labor progresses; no need for medications Fetal Wellbeing:  Category II; FHR baseline increased but not tachycardic yet Pain Control:  Epidural Anticipated MOD:  NSVD  Expectant management   JaLuiz BlareDO 10/31/2014, 5:52 PM PGY-1, [place]

## 2014-10-31 NOTE — Progress Notes (Signed)
Patient ID: Kristy Miller, female   DOB: 08/13/1995, 19 y.o.   MRN: [id number] Kristy Miller is a 19 y.o. G1P0 at [redacted]w[redacted]d admitted for induction of labor due to [facility], postdates.  Subjective: Feeling better, headache gone, feels urge to push  Objective: BP 145/86 mmHg  Pulse 102  Temp(Src) 98.2 F (36.8 C) (Oral)  Resp 18  Ht 5\' 4"  (1.626 m)  Wt 66.679 kg (147 lb)  BMI 25.22 kg/m2  SpO2 100%  LMP 02/05/2014    FHT:  FHR: 150 bpm, variability: min-mod,  accelerations:  Present,  decelerations:  Absent UC:   q 2-52min  SVE:   Dilation: 10 Effacement (%): 100 Station: +2 (caput +3) Exam by:: kbooker, cnm  Pitocin @ 2 mu/min, began @ 2030 to help slightly irregular uc's  Labs: Lab Results  Component Value Date   WBC 15.9* 10/31/2014   HGB 12.3 10/31/2014   HCT 34.7* 10/31/2014   MCV 89.0 10/31/2014   PLT 159 10/31/2014    Assessment / Plan: IOL d/t GHTN & postdates, began pushing around 2040- not pushing well yet. To increase pitocin as needed to keep uc's q 2-8mins, continue to push, will monitor progress  Labor: Progressing normally Fetal Wellbeing:  Category I Pain Control:  Epidural Pre-eclampsia: GHTN w/ normal pre-e labs I/D:  Amp & Natasha Bence for suspected Triple I Anticipated MOD:  NSVD  [name] CNM, WHNP-BC 10/31/2014, 9:15 PM

## 2014-10-31 NOTE — Progress Notes (Signed)
Labor Progress Note  Kristy Miller is a 19 y.o. G1P0 at [redacted]w[redacted]d  admitted for induction of labor due to [facility] and risk for LGA.  S: Sleeping comfortably.   O:  BP 123/77 mmHg  Pulse 74  Temp(Src) 98.7 F (37.1 C) (Oral)  Resp 20  Ht 5\' 4"  (1.626 m)  Wt 147 lb (66.679 kg)  BMI 25.22 kg/m2  SpO2 100%  LMP 02/05/2014 FHT:  FHR: 125 bpm, variability: minimal ,  accelerations:  Present,  decelerations:  Absent UC:   regular, every 2-6 minutes SVE:   Dilation: 5.5 Effacement (%): 80 Station: -1 Exam by:: Dr. Doroteo Glassman SROM @0600 : clear  Cytotec x2  Labs: Lab Results  Component Value Date   WBC 11.5* 10/31/2014   HGB 12.4 10/31/2014   HCT 34.4* 10/31/2014   MCV 88.2 10/31/2014   PLT 154 10/31/2014    Assessment / Plan: 19 y.o. G1P0 [redacted]w[redacted]d in early labor. Doing well. Induction of labor due to gestational hypertension and and risk for LGA  Labor: Now s/p FB and cytotec. SROM @0600 . Patient with good contraction pattern with cervical change. Will augment with Pitocin as needed.  gHTN: BPs have been stable Fetal Wellbeing:  Category II; monitor for improvement in variability as phenergan wears off  Pain Control:  Epidural Anticipated MOD:  NSVD  Expectant management   Caryl Ada, DO 10/31/2014, 12:22 PM PGY-1, [facility]

## 2014-10-31 NOTE — Progress Notes (Signed)
Kristy Miller is a 19 y.o. G1P0 at [redacted]w[redacted]d   Subjective: Resting and not feeling ctx as much; rec'd a dose of Fentanyl at approx 2330  Objective: BP 133/88 mmHg  Pulse 63  Temp(Src) 97.6 F (36.4 C) (Oral)  Resp 20  Ht 5\' 4"  (1.626 m)  Wt 66.679 kg (147 lb)  BMI 25.22 kg/m2  LMP 02/05/2014      FHT:  FHR: 130s bpm, variability: minimal ,  accelerations:  Present,  decelerations:  Absent10x10 accels only noted, probably due to medication UC:   irregular, every 2-6 minutes SVE:   Dilation: 2 Effacement (%): 60 Station: -1 Exam by:: Kristy Miller, CNM- foley bulb placed without difficulty  Labs: Lab Results  Component Value Date   WBC 6.0 10/30/2014   HGB 12.3 10/30/2014   HCT 34.8* 10/30/2014   MCV 88.1 10/30/2014   PLT 155 10/30/2014    Assessment / Plan: IUP@40 .4wks Mild gHTN- BPs stable LGA  Foley to stay in place until it comes out May have Fentanyl prn pain  Kristy Miller CNM 10/31/2014, 12:45 AM
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
## 2014-10-31 NOTE — Anesthesia Procedure Notes (Signed)
Epidural Patient location during procedure: OB  Staffing Anesthesiologist: Phillips Grout Performed by: anesthesiologist   Preanesthetic Checklist Completed: patient identified, site marked, surgical consent, pre-op evaluation, timeout performed, IV checked, risks and benefits discussed and monitors and equipment checked  Epidural Patient position: sitting Prep: DuraPrep Patient monitoring: heart rate, continuous pulse ox and blood pressure Injection technique: LOR saline  Needle:  Needle type: Tuohy  Needle gauge: 17 G Needle length: 9 cm and 9 Needle insertion depth: 5 cm Catheter type: closed end flexible Catheter size: 20 Guage Catheter at skin depth: 9 cm Test dose: negative  Assessment Events: blood not aspirated, injection not painful, no injection resistance, negative IV test and no paresthesia  Additional Notes Patient identified. Risks/Benefits/Options discussed with patient including but not limited to bleeding, infection, nerve damage, paralysis, failed block, incomplete pain control, headache, blood pressure changes, nausea, vomiting, reactions to medication both or allergic, itching and postpartum back pain. Confirmed with bedside nurse the patient's most recent platelet count. Confirmed with patient that they are not currently taking any anticoagulation, have any bleeding history or any family history of bleeding disorders. Patient expressed understanding and wished to proceed. All questions were answered. Sterile technique was used throughout the entire procedure. Please see nursing notes for vital signs. Test dose was given through epidural needle and negative prior to continuing to dose epidural or start infusion. Warning signs of high block given to the patient including shortness of breath, tingling/numbness in hands, complete motor block, or any concerning symptoms with instructions to call for help. Patient was given instructions on fall risk and not to get out of  bed. All questions and concerns addressed with instructions to call with any issues.

## 2014-10-31 NOTE — Anesthesia Preprocedure Evaluation (Signed)
Anesthesia Evaluation  Patient identified by MRN, date of birth, ID band Patient awake    Reviewed: Allergy & Precautions, H&P , NPO status , Patient's Chart, lab work & pertinent test results  History of Anesthesia Complications Negative for: history of anesthetic complications  Airway Mallampati: II  TM Distance: >3 FB Neck ROM: full    Dental no notable dental hx. (+) Teeth Intact   Pulmonary neg pulmonary ROS, asthma ,  breath sounds clear to auscultation  Pulmonary exam normal       Cardiovascular negative cardio ROS Normal cardiovascular examRhythm:regular Rate:Normal     Neuro/Psych negative neurological ROS  negative psych ROS   GI/Hepatic negative GI ROS, Neg liver ROS,   Endo/Other  negative endocrine ROS  Renal/GU negative Renal ROS  negative genitourinary   Musculoskeletal   Abdominal   Peds  Hematology negative hematology ROS (+)   Anesthesia Other Findings   Reproductive/Obstetrics (+) Pregnancy                             Anesthesia Physical Anesthesia Plan  ASA: II  Anesthesia Plan: Epidural   Post-op Pain Management:    Induction:   Airway Management Planned:   Additional Equipment:   Intra-op Plan:   Post-operative Plan:   Informed Consent: I have reviewed the patients History and Physical, chart, labs and discussed the procedure including the risks, benefits and alternatives for the proposed anesthesia with the patient or authorized representative who has indicated his/her understanding and acceptance.     Plan Discussed with:   Anesthesia Plan Comments:         Anesthesia Quick Evaluation

## 2014-10-31 NOTE — Progress Notes (Signed)
ANTIBIOTIC CONSULT NOTE - INITIAL  Pharmacy Consult for Gentamicin Indication: Chorioamnionitis   No Known Allergies  Patient Measurements: Height: 5\' 4"  (162.6 cm) Weight: 147 lb (66.679 kg) IBW/kg (Calculated) : 54.7 kg Adjusted Body Weight: 58 kg  Vital Signs: Temp: 102.3 F (39.1 C) (06/18 1900) Temp Source: Oral (06/18 1900) BP: 148/85 mmHg (06/18 1900) Pulse Rate: 97 (06/18 1900)  Labs:  Recent Labs  10/30/14 1119 10/30/14 1125 10/31/14 1030 10/31/14 1850  WBC 6.0  --  11.5* 15.9*  HGB 12.3  --  12.4 12.3  PLT 155  --  154 159  LABCREA  --  263.00  --   --   CREATININE 0.79  --   --   --       Medications:  Ampicillin 2 grams IV Q 6 hr  Assessment: 19 y.o. female G1P0 at [redacted]w[redacted]d now with fever during labor Estimated Ke = 0.295, Vd = 0.33 L/kg  Goal of Therapy:  Gentamicin peak 6-8 mg/L and Trough < 1 mg/L  Plan:  Gentamicin 120 mg IV every 8 hrs  Check Scr with next labs if gentamicin continued. Will check gentamicin levels if continued > 72hr or clinically indicated.  Natasha Bence 10/31/2014,7:34 PM

## 2014-10-31 NOTE — Progress Notes (Signed)
Labor Progress Note  Kristy Miller is a 19 y.o. G1P0 at [redacted]w[redacted]d  admitted for induction of labor due to [facility] and risk for LGA.  S: Patient resting comfortably in room after stadol/phenergan combination.   O:  BP 133/71 mmHg  Pulse 88  Temp(Src) 98.7 F (37.1 C) (Oral)  Resp 18  Ht 5\' 4"  (1.626 m)  Wt 147 lb (66.679 kg)  BMI 25.22 kg/m2  LMP 02/05/2014 FHT:  FHR: 125 bpm, variability: minimal ,  accelerations:  Abscent,  decelerations:  Absent UC:   regular, every 2-4 minutes SVE:   Dilation: 2 Effacement (%): 60 Station: -1 Exam by:: Lauraine Rinne, RN  SROM @0600 : clear  Cytotec x2  Labs: Lab Results  Component Value Date   WBC 6.0 10/30/2014   HGB 12.3 10/30/2014   HCT 34.8* 10/30/2014   MCV 88.1 10/30/2014   PLT 155 10/30/2014    Assessment / Plan: 19 y.o. G1P0 [redacted]w[redacted]d in early labor. Doing well. Induction of labor due to gestational hypertension and and risk for LGA  Labor: FB still in place; s/p cytotec x2. SROM @0600 .  GHTN: BPs have beens table Fetal Wellbeing:  Category II Pain Control:  Stadol/phenergan Anticipated MOD:  NSVD  Expectant management   Caryl Ada, DO 10/31/2014, 9:09 AM PGY-1, [facility]

## 2014-11-01 ENCOUNTER — Encounter (HOSPITAL_COMMUNITY): Payer: Self-pay

## 2014-11-01 DIAGNOSIS — Z3A40 40 weeks gestation of pregnancy: Secondary | ICD-10-CM

## 2014-11-01 MED ORDER — IBUPROFEN 600 MG PO TABS
600.0000 mg | ORAL_TABLET | Freq: Four times a day (QID) | ORAL | Status: DC
  Administered 2014-11-01 – 2014-11-02 (×7): 600 mg via ORAL
  Filled 2014-11-01 (×7): qty 1

## 2014-11-01 MED ORDER — SODIUM CHLORIDE 0.9 % IV SOLN: 250.0000 mL | INTRAVENOUS | Status: DC | PRN

## 2014-11-01 MED ORDER — MEASLES, MUMPS & RUBELLA VAC ~~LOC~~ INJ
0.5000 mL | INJECTION | Freq: Once | SUBCUTANEOUS | Status: DC
  Filled 2014-11-01: qty 0.5

## 2014-11-01 MED ORDER — OXYCODONE-ACETAMINOPHEN 5-325 MG PO TABS: 2.0000 | ORAL_TABLET | ORAL | Status: DC | PRN

## 2014-11-01 MED ORDER — ZOLPIDEM TARTRATE 5 MG PO TABS: 5.0000 mg | ORAL_TABLET | Freq: Every evening | ORAL | Status: DC | PRN

## 2014-11-01 MED ORDER — LANOLIN HYDROUS EX OINT: TOPICAL_OINTMENT | CUTANEOUS | Status: DC | PRN

## 2014-11-01 MED ORDER — WITCH HAZEL-GLYCERIN EX PADS: 1.0000 "application " | MEDICATED_PAD | CUTANEOUS | Status: DC | PRN

## 2014-11-01 MED ORDER — SODIUM CHLORIDE 0.9 % IJ SOLN: 3.0000 mL | INTRAMUSCULAR | Status: DC | PRN

## 2014-11-01 MED ORDER — SENNOSIDES-DOCUSATE SODIUM 8.6-50 MG PO TABS
2.0000 | ORAL_TABLET | ORAL | Status: DC
  Administered 2014-11-01: 2 via ORAL
  Filled 2014-11-01: qty 2

## 2014-11-01 MED ORDER — ONDANSETRON HCL 4 MG/2ML IJ SOLN
4.0000 mg | INTRAMUSCULAR | Status: DC | PRN
Start: 2014-11-01 — End: 2014-11-02

## 2014-11-01 MED ORDER — DIPHENHYDRAMINE HCL 25 MG PO CAPS: 25.0000 mg | ORAL_CAPSULE | Freq: Four times a day (QID) | ORAL | Status: DC | PRN

## 2014-11-01 MED ORDER — DIBUCAINE 1 % RE OINT: 1.0000 "application " | TOPICAL_OINTMENT | RECTAL | Status: DC | PRN

## 2014-11-01 MED ORDER — OXYTOCIN 40 UNITS IN LACTATED RINGERS INFUSION - SIMPLE MED: 62.5000 mL/h | INTRAVENOUS | Status: DC | PRN

## 2014-11-01 MED ORDER — SODIUM CHLORIDE 0.9 % IJ SOLN
3.0000 mL | Freq: Two times a day (BID) | INTRAMUSCULAR | Status: DC
  Administered 2014-11-01: 3 mL via INTRAVENOUS

## 2014-11-01 MED ORDER — OXYCODONE-ACETAMINOPHEN 5-325 MG PO TABS
1.0000 | ORAL_TABLET | ORAL | Status: DC | PRN
  Administered 2014-11-01 (×2): 1 via ORAL
  Filled 2014-11-01 (×2): qty 1

## 2014-11-01 MED ORDER — FLEET ENEMA 7-19 GM/118ML RE ENEM: 1.0000 | ENEMA | Freq: Every day | RECTAL | Status: DC | PRN

## 2014-11-01 MED ORDER — ONDANSETRON HCL 4 MG PO TABS: 4.0000 mg | ORAL_TABLET | ORAL | Status: DC | PRN

## 2014-11-01 MED ORDER — BENZOCAINE-MENTHOL 20-0.5 % EX AERO
1.0000 "application " | INHALATION_SPRAY | CUTANEOUS | Status: DC | PRN
  Filled 2014-11-01: qty 56

## 2014-11-01 MED ORDER — BISACODYL 10 MG RE SUPP: 10.0000 mg | Freq: Every day | RECTAL | Status: DC | PRN

## 2014-11-01 MED ORDER — TETANUS-DIPHTH-ACELL PERTUSSIS 5-2.5-18.5 LF-MCG/0.5 IM SUSP: 0.5000 mL | Freq: Once | INTRAMUSCULAR | Status: DC

## 2014-11-01 MED ORDER — SIMETHICONE 80 MG PO CHEW: 80.0000 mg | CHEWABLE_TABLET | ORAL | Status: DC | PRN

## 2014-11-01 MED ORDER — ACETAMINOPHEN 325 MG PO TABS: 650.0000 mg | ORAL_TABLET | ORAL | Status: DC | PRN

## 2014-11-01 MED ORDER — PRENATAL MULTIVITAMIN CH
1.0000 | ORAL_TABLET | Freq: Every day | ORAL | Status: DC
  Administered 2014-11-01 – 2014-11-02 (×2): 1 via ORAL
  Filled 2014-11-01 (×2): qty 1

## 2014-11-01 NOTE — Lactation Note (Signed)
This note was copied from the chart of Kristy Syvilla Lightcap. Lactation Consultation Note   P1.  BFx5, 1 voids and 2 stools in 14 hours of life. Upon entering room mother has baby STS after a 15 min feeding. Mother states she has been taught hand expression and has viewed colostrum. Denies soreness or problems. Reviewed basics, cluster feeding, monitoring voids/stools. Mom encouraged to feed baby 8-12 times/24 hours and with feeding cues.  Mom made aware of O/P services, breastfeeding support groups, community resources, and our phone # for post-discharge questions.     Patient Name: Kristy Miller [id number] Date: 11/01/2014 Reason for consult: Initial assessment   Maternal Data Has patient been taught Hand Expression?: Yes Does the patient have breastfeeding experience prior to this delivery?: No  Feeding Feeding Type: Breast Fed Length of feed: 15 min  LATCH Score/Interventions Latch: Grasps breast easily, tongue down, lips flanged, rhythmical sucking. Intervention(s): Assist with latch;Adjust position  Audible Swallowing: A few with stimulation  Type of Nipple: Everted at rest and after stimulation  Comfort (Breast/Nipple): Soft / non-tender     Hold (Positioning): Assistance needed to correctly position infant at breast and maintain latch.  LATCH Score: 8  Lactation Tools Discussed/Used     Consult Status Consult Status: Follow-up Date: 11/02/14 Follow-up type: In-patient    Dahlia Byes [facility] 11/01/2014, 2:57 PM

## 2014-11-01 NOTE — Anesthesia Postprocedure Evaluation (Signed)
  Anesthesia Post-op Note  Patient: Kristy Miller  Procedure(s) Performed: * No procedures listed *  Patient Location: Mother/Baby  Anesthesia Type:Epidural  Level of Consciousness: awake, alert , oriented and patient cooperative  Airway and Oxygen Therapy: Patient Spontanous Breathing  Post-op Pain: none  Post-op Assessment: Post-op Vital signs reviewed, Patient's Cardiovascular Status Stable, Respiratory Function Stable, Patent Airway, No headache, No backache and Patient able to bend at knees              Post-op Vital Signs: Reviewed and stable  Last Vitals:  Filed Vitals:   11/01/14 0335  BP: 138/82  Pulse: 91  Temp: 36.7 C  Resp: 18    Complications: No apparent anesthesia complications

## 2014-11-02 ENCOUNTER — Telehealth (HOSPITAL_COMMUNITY): Payer: Self-pay | Admitting: *Deleted

## 2014-11-02 MED ORDER — OXYCODONE-ACETAMINOPHEN 5-325 MG PO TABS: 1.0000 | ORAL_TABLET | ORAL | Status: DC | PRN

## 2014-11-02 NOTE — Telephone Encounter (Signed)
Preadmission screen  

## 2014-11-02 NOTE — Progress Notes (Signed)
Post Partum Day 1  Subjective:  Kristy Miller is a 19 y.o. G1P1001 [redacted]w[redacted]d s/p VAVD.  No acute events overnight.  Pt denies problems with ambulating, voiding or po intake.  She denies nausea or vomiting.  Pain is moderately controlled. Endorsing back and pelvic soreness.  She has had flatus. She has not had bowel movement.  Lochia Small.  Plan for birth control is oral progesterone-only contraceptive.  Method of Feeding: Breast.    Objective: BP 120/79 mmHg  Pulse 67  Temp(Src) 97.1 F (36.2 C) (Oral)  Resp 18  Ht 5\' 4"  (1.626 m)  Wt 147 lb (66.679 kg)  BMI 25.22 kg/m2  SpO2 100%  LMP 02/05/2014  Breastfeeding? Unknown  Physical Exam:  General: alert, cooperative and no distress Lochia:normal flow Chest: CTAB Heart: RRR no m/r/g Abdomen: +BS, soft, nontender Uterine Fundus: firm DVT Evaluation: No evidence of DVT seen on physical exam. Extremities: no edema   Recent Labs  10/31/14 1030 10/31/14 1850  HGB 12.4 12.3  HCT 34.4* 34.7*    Assessment/Plan:  ASSESSMENT: Kristy Miller is a 19 y.o. G1P1001 [redacted]w[redacted]d ppd #1 s/p VAVD doing well. VSS.  Plan for discharge tomorrow or sooner if desired Breastfeeding Continue routine postpartum care    LOS: 3 days    Caryl Ada, DO 11/02/2014, 7:52 AM PGY-1, [facility]

## 2014-11-02 NOTE — Discharge Instructions (Signed)

## 2014-11-02 NOTE — Lactation Note (Signed)
This note was copied from the chart of Kristy Miller. Lactation Consultation Note  Patient Name: Kristy Miller [id number] Date: 11/02/2014 Reason for consult: Follow-up assessment  Observed Mom with baby on breast in cross cradle hold.  Baby came off, and LC adjusted pillow support and use of U hold for breast support.  Baby latched on easily and deeply.  No discomfort felt, multiple swallowing seen.  Teaching done of basics.  Engorgement prevention and treatment discussed.  Encouraged her to call for assistance, and reminded her of OP BF Support Groups available.  Encouraged skin to skin, feeding baby on cue.     Consult Status Consult Status: Complete Date: 11/02/14 Follow-up type: Call as needed    Ronja, Bertolini 11/02/2014, 12:03 PM

## 2014-11-02 NOTE — Discharge Summary (Signed)
Obstetric Discharge Summary Reason for Admission: induction of labor Prenatal Procedures: NST and ultrasound Intrapartum Procedures: vacuum Postpartum Procedures:  none Complications-Operative and Postpartum: 2nd degree  Delivery Note At 12:00 AM a viable female was delivered via Kiwi Vaginal, Vacuum (Extractor) (Presentation: Left Occiput Anterior) by Dr. Jolayne Panther due to maternal exhaustion and slowed progress after 3 hours of pushing.  No pop offs. The shoulders were not forthcoming, 1st maneuver: McRobert's, 2nd maneuver: suprapubic pressure which resolved dystocia. Total dystocia time ~30sec. APGAR: 8, 9; weight: pending at time of note.   Placenta status: Intact, Spontaneous.  Cord: 3 vessels with the following complications: None.  Cord pH: pending  Anesthesia: Epidural  Episiotomy: None Lacerations: 2nd degree Suture Repair: 3.0 vicryl rapide Est. Blood Loss (mL):  200  Mom to postpartum.  Baby to Couplet care / Skin to Skin. Plans to breastfeed, POPs for contraception.   [name] 11/01/2014, 12:37 AM   Hospital Course:  Active Problems:   Gestational hypertension w/o significant proteinuria in 3rd trimester   Kristy Miller is a 19 y.o. G1P1001 s/p VAVD.  Patient was admitted for IOL 2/2 gHTN.  She received abx for presumed triple I.  She has been afebrile for greater than 24hours and is >36h postpartum.  She has postpartum course that was uncomplicated including no problems with ambulating, PO intake, urination, pain, or bleeding. The pt feels ready to go home and  will be discharged with outpatient follow-up.   Today: No acute events overnight.  Pt denies problems with ambulating, voiding or po intake.  She denies nausea or vomiting.  Pain is well controlled.  She has had flatus. Plan for birth control is  POP.  Method of Feeding: breast  Physical Exam:  See progress note from today  H/H: Lab Results  Component Value Date/Time   HGB 12.3 10/31/2014  06:50 PM   HGB 10.6 06/11/2014   HCT 34.7* 10/31/2014 06:50 PM   HCT 33 06/11/2014    Discharge Diagnoses: Term Pregnancy-delivered  Discharge Information: Date: 11/02/2014 Activity: pelvic rest Diet: routine  Medications: None Breast feeding:  Yes Condition: stable Instructions: refer to handout Discharge to: home       Discharge Instructions    Call MD for:  redness, tenderness, or signs of infection (pain, swelling, redness, odor or green/yellow discharge around incision site)    Complete by:  As directed      Call MD for:  severe uncontrolled pain    Complete by:  As directed      Call MD for:  temperature >100.4    Complete by:  As directed      Diet - low sodium heart healthy    Complete by:  As directed             Medication List    TAKE these medications        multivitamin-prenatal 27-0.8 MG Tabs tablet  Take 1 tablet by mouth daily at 12 noon.       Follow-up Information    Follow up with [facility] In 5 weeks.   Specialty:  Obstetrics and Gynecology   Contact information:   [address] [place] [address] [phone]      Perry Mount ,MD OB Fellow 11/02/2014,12:35 PM

## 2014-11-04 ENCOUNTER — Inpatient Hospital Stay (HOSPITAL_COMMUNITY): Admission: RE | Admit: 2014-11-04 | Payer: Medicaid Other | Source: Ambulatory Visit

## 2014-11-04 NOTE — Addendum Note (Signed)
Addendum  created 11/04/14 1232 by Fabienne Bruns, RN   Modules edited: Anesthesia LDA, Lines/Drains/Airways Properties Editor   Lines/Drains/Airways Properties Editor:  Properties of line/drain/airway/wound [REMOVED] Perineural (Nerve Sheath) Catheter 10/31/14 have been modified.

## 2014-11-27 ENCOUNTER — Telehealth: Payer: Self-pay | Admitting: *Deleted

## 2014-11-27 NOTE — Telephone Encounter (Addendum)
Received message left on nurse line today at [phone].  Patient states she is calling to ask about breast pain.  7/15  1010  Called pt and left message that I am returning her call. Please call back and provide additional information about her concern such as when it began and the severity. Also, please state whether a detailed message can be left on her voice mail.  Diane Day RNC

## 2014-12-03 NOTE — Telephone Encounter (Signed)
mychart message to patient. 

## 2014-12-10 ENCOUNTER — Encounter: Payer: Self-pay | Admitting: Medical

## 2014-12-10 ENCOUNTER — Ambulatory Visit (INDEPENDENT_AMBULATORY_CARE_PROVIDER_SITE_OTHER): Payer: Medicaid Other | Admitting: Medical

## 2014-12-10 VITALS — BP 134/98 | HR 67 | Temp 98.5°F | Wt 124.8 lb

## 2014-12-10 DIAGNOSIS — N898 Other specified noninflammatory disorders of vagina: Secondary | ICD-10-CM

## 2014-12-10 DIAGNOSIS — Z3009 Encounter for other general counseling and advice on contraception: Secondary | ICD-10-CM

## 2014-12-10 DIAGNOSIS — Z8759 Personal history of other complications of pregnancy, childbirth and the puerperium: Secondary | ICD-10-CM | POA: Insufficient documentation

## 2014-12-10 MED ORDER — NORETHINDRONE 0.35 MG PO TABS: 1.0000 | ORAL_TABLET | Freq: Every day | ORAL | Status: DC

## 2014-12-10 NOTE — Progress Notes (Signed)
Patient ID: Kristy Miller, female   DOB: 1995-07-18, 19 y.o.   MRN: [id number] Subjective:     Kristy Miller is a 19 y.o. female who presents for a postpartum visit. She is 6 weeks postpartum following a SVD- Vacuum. I have fully reviewed the prenatal and intrapartum course. The delivery was at 40.5 gestational weeks. Outcome: spontaneous vaginal delivery and vacuum, outlet. Anesthesia: epidural. Postpartum course has been normal. Baby's course has been normal. Baby is feeding by both breast and bottle - Similac Advance. Bleeding no bleeding. Bowel function is normal. Bladder function is normal. Patient is not sexually active. Contraception method is none. Postpartum depression screening: negative.  The following portions of the patient's history were reviewed and updated as appropriate: allergies, current medications, past family history, past medical history, past social history, past surgical history and problem list.  Review of Systems Pertinent items are noted in HPI.   Objective:    BP 134/98 mmHg  Pulse 67  Temp(Src) 98.5 F (36.9 C)  Wt 124 lb 12.8 oz (56.609 kg)  Breastfeeding? Yes  General:  alert and cooperative   Breasts:  not performed  Lungs: normal effort  Heart:  regular rate  Abdomen: soft, non-tender; bowel sounds normal; no masses,  no organomegaly   Vulva:  normal  Vagina: vagina positive for small amount of thin, white discharge; scant brown blood  Cervix:  no cervical motion tenderness and no lesions  Corpus: normal size, contour, position, consistency, mobility, non-tender  Adnexa:  normal adnexa and no mass, fullness, tenderness  Rectal Exam: Not performed.        Assessment:     Normal postpartum exam. Pap smear not done at today's visit.  Pap smear due at age 66 years  Plan:    1. Contraception: oral progesterone-only contraceptive 2. Referral to [facility] for primary care and management of possible HTN 3. Follow up in: 1 year for annual exam or sooner as  needed.

## 2014-12-10 NOTE — Patient Instructions (Signed)
Oral Contraception Use Oral contraceptive pills (OCPs) are medicines taken to prevent pregnancy. OCPs work by preventing the ovaries from releasing eggs. The hormones in OCPs also cause the cervical mucus to thicken, preventing the sperm from entering the uterus. The hormones also cause the uterine lining to become thin, not allowing a fertilized egg to attach to the inside of the uterus. OCPs are highly effective when taken exactly as prescribed. However, OCPs do not prevent sexually transmitted diseases (STDs). Safe sex practices, such as using condoms along with an OCP, can help prevent STDs. Before taking OCPs, you may have a physical exam and Pap test. Your health care provider may also order blood tests if necessary. Your health care provider will make sure you are a good candidate for oral contraception. Discuss with your health care provider the possible side effects of the OCP you may be prescribed. When starting an OCP, it can take 2 to 3 months for the body to adjust to the changes in hormone levels in your body.  HOW TO TAKE ORAL CONTRACEPTIVE PILLS Your health care provider may advise you on how to start taking the first cycle of OCPs. Otherwise, you can:   Start on day 1 of your menstrual period. You will not need any backup contraceptive protection with this start time.   Start on the first Sunday after your menstrual period or the day you get your prescription. In these cases, you will need to use backup contraceptive protection for the first week.   Start the pill at any time of your cycle. If you take the pill within 5 days of the start of your period, you are protected against pregnancy right away. In this case, you will not need a backup form of birth control. If you start at any other time of your menstrual cycle, you will need to use another form of birth control for 7 days. If your OCP is the type called a minipill, it will protect you from pregnancy after taking it for 2 days (48  hours). After you have started taking OCPs:   If you forget to take 1 pill, take it as soon as you remember. Take the next pill at the regular time.   If you miss 2 or more pills, call your health care provider because different pills have different instructions for missed doses. Use backup birth control until your next menstrual period starts.   If you use a 28-day pack that contains inactive pills and you miss 1 of the last 7 pills (pills with no hormones), it will not matter. Throw away the rest of the non-hormone pills and start a new pill pack.  No matter which day you start the OCP, you will always start a new pack on that same day of the week. Have an extra pack of OCPs and a backup contraceptive method available in case you miss some pills or lose your OCP pack.  HOME CARE INSTRUCTIONS   Do not smoke.   Always use a condom to protect against STDs. OCPs do not protect against STDs.   Use a calendar to mark your menstrual period days.   Read the information and directions that came with your OCP. Talk to your health care provider if you have questions.  SEEK MEDICAL CARE IF:   You develop nausea and vomiting.   You have abnormal vaginal discharge or bleeding.   You develop a rash.   You miss your menstrual period.   You are losing   your hair.   You need treatment for mood swings or depression.   You get dizzy when taking the OCP.   You develop acne from taking the OCP.   You become pregnant.  SEEK IMMEDIATE MEDICAL CARE IF:   You develop chest pain.   You develop shortness of breath.   You have an uncontrolled or severe headache.   You develop numbness or slurred speech.   You develop visual problems.   You develop pain, redness, and swelling in the legs.  Document Released: 04/20/2011 Document Revised: 09/15/2013 Document Reviewed: 10/20/2012 Exit[employer] Patient Information 2015 [employer]. This information is not intended to replace  advice given to you by your health care provider. Make sure you discuss any questions you have with your health care provider.  

## 2014-12-10 NOTE — Progress Notes (Deleted)
Patient ID: Kristy Miller, female   DOB: 1995/09/13, 19 y.o.   MRN: [id number] Subjective:     Kristy Miller is a 19 y.o. female who presents for a postpartum visit. She is {1-10:13787} weeks postpartum following a spontaneous vaginal delivery. I have fully reviewed the prenatal and intrapartum course. The delivery was at *** gestational weeks. Outcome: vacuum, outlet. Anesthesia: epidural. Postpartum course has been ***. Baby's course has been ***. Baby is feeding by both breast and bottle - Similac Advance. Bleeding staining only. Bowel function is normal. Bladder function is normal. Patient is not sexually active. Contraception method is OCP (estrogen/progesterone). Postpartum depression screening: negative.  {Common ambulatory SmartLinks:19316}  Review of Systems {ros; complete:30496}   Objective:    BP 143/100 mmHg  Pulse 67  Temp(Src) 98.5 F (36.9 C)  Wt 124 lb 12.8 oz (56.609 kg)  Breastfeeding? Yes  General:  {gen appearance:16600}   Breasts:  {breast exam:1202::"inspection negative, no nipple discharge or bleeding, no masses or nodularity palpable"}  Lungs: {lung exam:16931}  Heart:  {heart exam:5510}  Abdomen: {abdomen exam:16834}   Vulva:  {labia exam:12198}  Vagina: {vagina exam:12200}  Cervix:  {cervix exam:14595}  Corpus: {uterus exam:12215}  Adnexa:  {adnexa exam:12223}  Rectal Exam: {rectal/vaginal exam:12274}        Assessment:    *** postpartum exam. Pap smear {done:10129} at today's visit.   Plan:    1. Contraception: {method:5051} 2. *** 3. Follow up in: {1-10:13787} {time; units:19136} or as needed.

## 2014-12-11 ENCOUNTER — Telehealth: Payer: Self-pay | Admitting: General Practice

## 2014-12-11 ENCOUNTER — Other Ambulatory Visit: Payer: Self-pay | Admitting: Medical

## 2014-12-11 DIAGNOSIS — N760 Acute vaginitis: Principal | ICD-10-CM

## 2014-12-11 DIAGNOSIS — B9689 Other specified bacterial agents as the cause of diseases classified elsewhere: Secondary | ICD-10-CM

## 2014-12-11 LAB — WET PREP, GENITAL
Trich, Wet Prep: NONE SEEN
Yeast Wet Prep HPF POC: NONE SEEN

## 2014-12-11 MED ORDER — METRONIDAZOLE 500 MG PO TABS: 500.0000 mg | ORAL_TABLET | Freq: Two times a day (BID) | ORAL | Status: DC

## 2014-12-11 NOTE — Telephone Encounter (Signed)
Telephone call to patient regarding + BV and antibiotic sent to pharmacy. Informed patient and recommended avoiding alcohol while on medication. Patient verbalized understanding to all and had no questions

## 2016-07-13 IMAGING — US US OB DETAIL+14 WK
1 series · 12 of 28 positions shown · non-contrast
Comparison: none

[Series 1: us ob comp +14 wk mfm · 67 acquisitions, 12 frames shown]
[im 3/67]
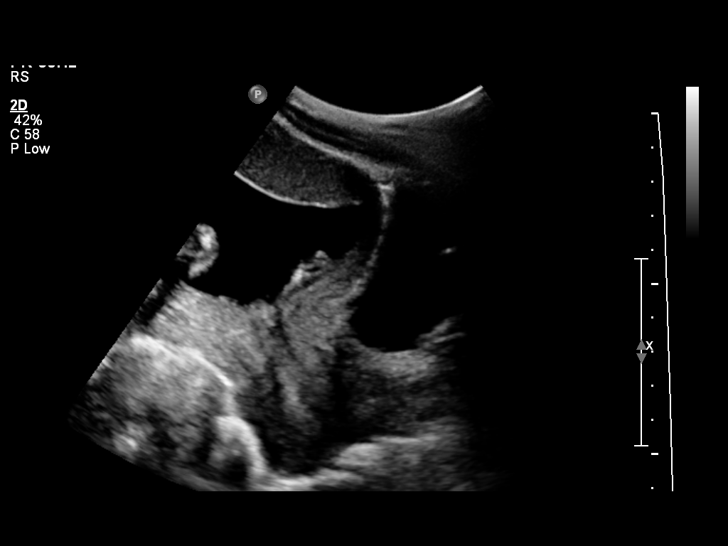
[im 8/67]
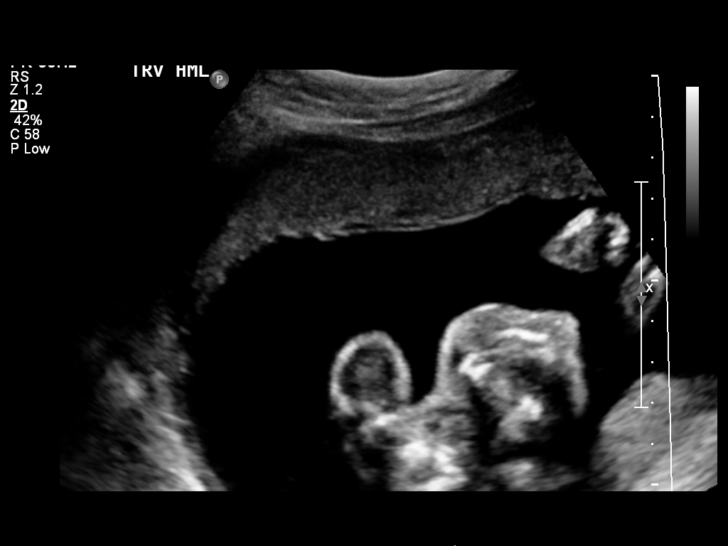
[im 13/67]
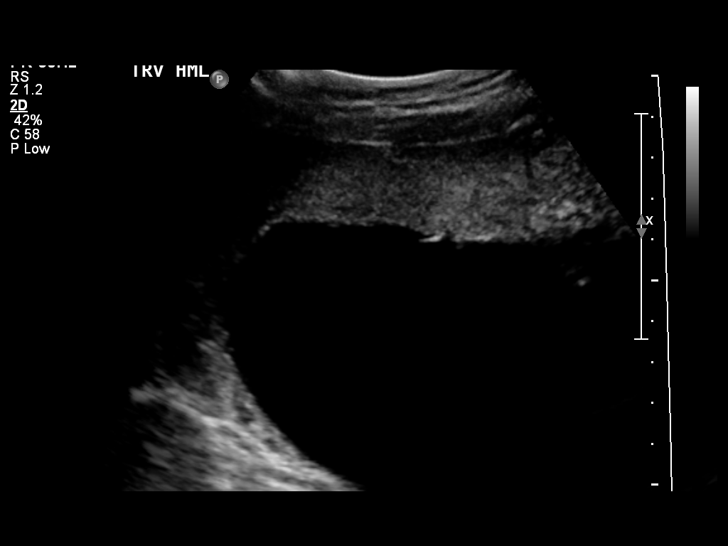
[im 20/67]
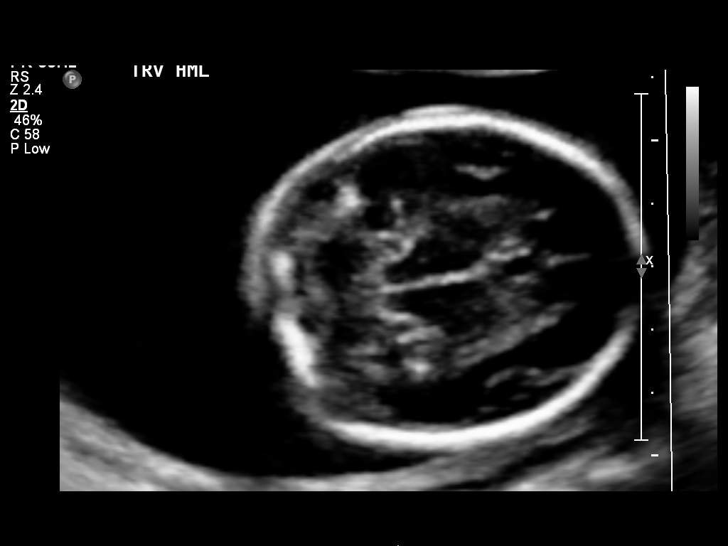
[im 25/67]
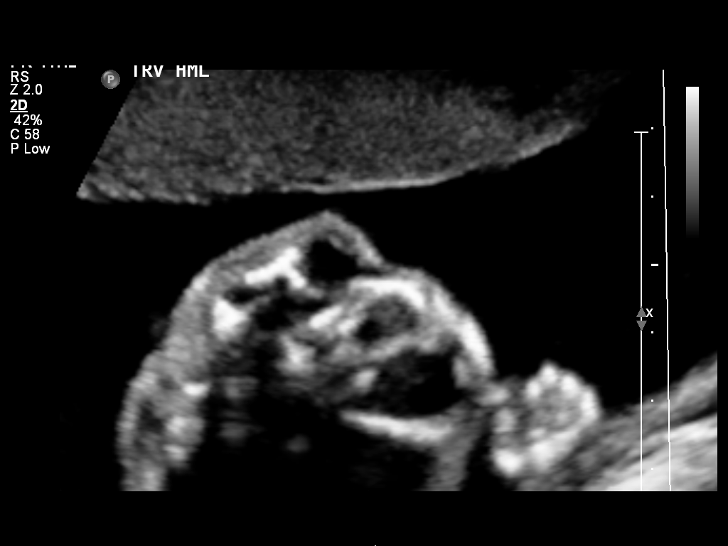
[im 30/67]
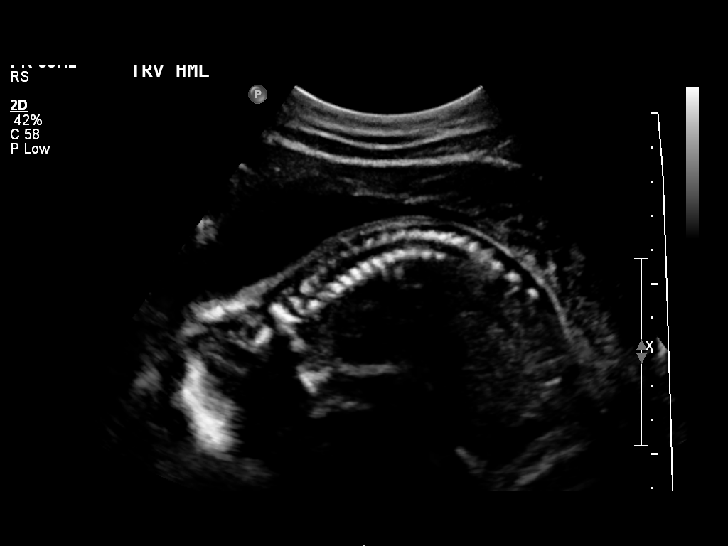
[im 37/67]
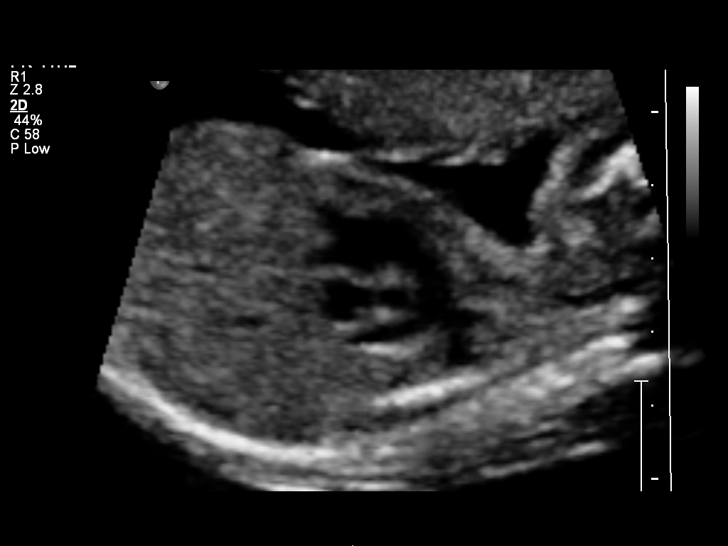
[im 42/67]
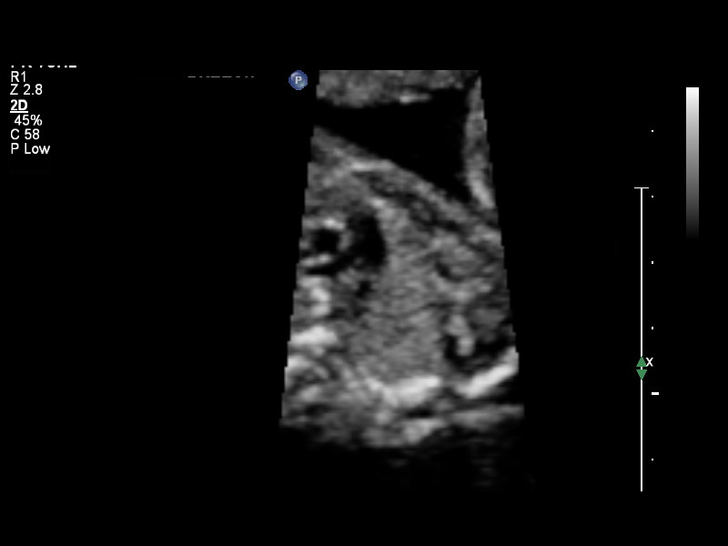
[im 47/67]
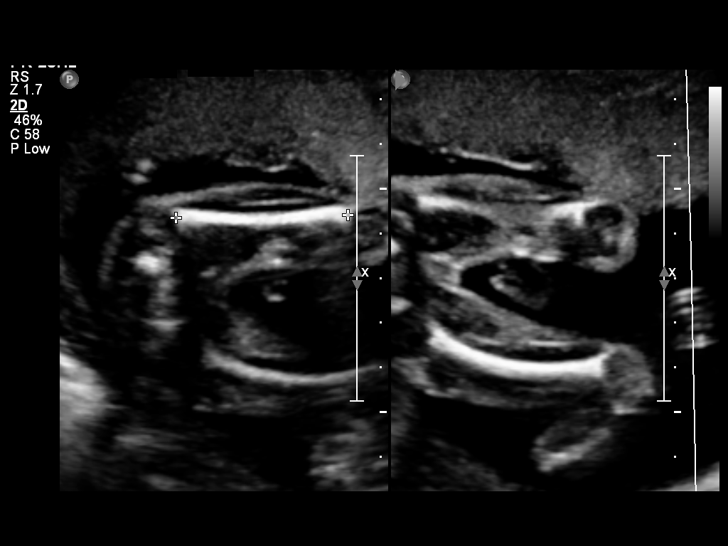
[im 54/67]
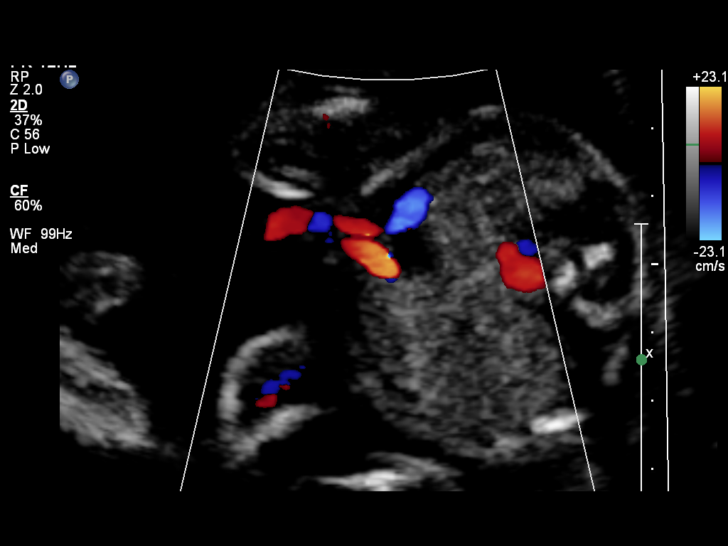
[im 59/67]
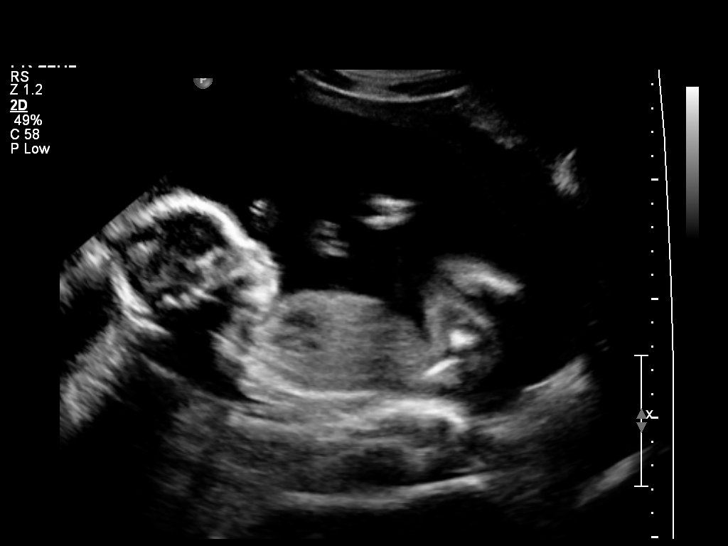
[im 64/67]
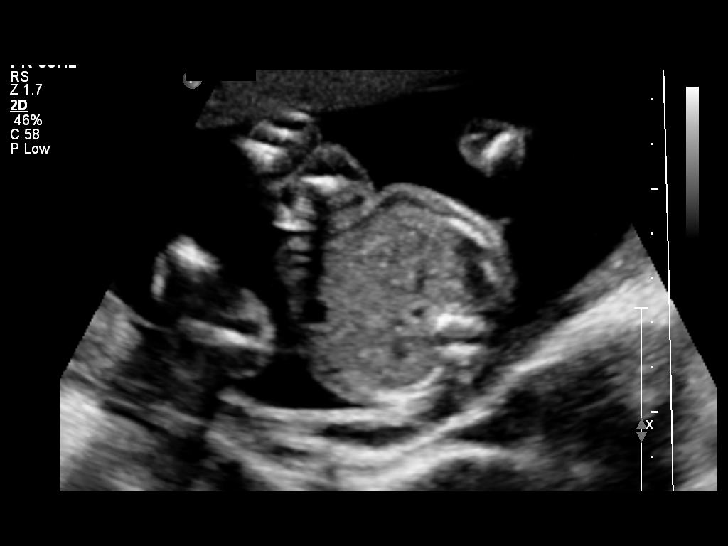

[12 of 28 positions shown; findings below may reference images not displayed]

OBSTETRICS REPORT
                      (Signed Final 06/18/2014 [DATE])

Service(s) Provided

 US OB DETAIL + 14 WK                                  76811.0
Indications

 Detailed fetal anatomic survey                        Z36
 21 weeks gestation of pregnancy
 Polyhydramnios
Fetal Evaluation

 Num Of Fetuses:    1
 Fetal Heart Rate:  165                          bpm
 Cardiac Activity:  Observed
 Presentation:      Variable
 Placenta:          Anterior, above cervical os
 P. Cord            Visualized, central
 Insertion:

 Amniotic Fluid
 AFI FV:      Polyhydramnios
                                             Larg Pckt:    8.16  cm
Biometry

 BPD:     50.1  mm     G. Age:  21w 1d                CI:         70.9   70 - 86
                                                      FL/HC:      20.0   15.9 -

 HC:     189.6  mm     G. Age:  21w 2d       38  %    HC/AC:      1.08   1.06 -

 AC:     175.1  mm     G. Age:  22w 3d       78  %    FL/BPD:
 FL:        38  mm     G. Age:  22w 1d       70  %    FL/AC:      21.7   20 - 24
 HUM:     36.7  mm     G. Age:  22w 6d       85  %

 Est. FW:     480  gm      1 lb 1 oz     58  %
Gestational Age

 U/S Today:     21w 5d                                        EDD:   10/24/14
 Best:          21w 2d     Det. By:  Early Ultrasound         EDD:   10/27/14
Anatomy

 Cranium:          Appears normal         Aortic Arch:      Appears normal
 Fetal Cavum:      Appears normal         Ductal Arch:      Appears normal
 Ventricles:       Appears normal         Diaphragm:        Appears normal
 Choroid Plexus:   Appears normal         Stomach:          Appears normal, left
                                                            sided
 Cerebellum:       Appears normal         Abdomen:          Appears normal
 Posterior Fossa:  Appears normal         Abdominal Wall:   Appears nml (cord
                                                            insert, abd wall)
 Nuchal Fold:      Not applicable (>20    Cord Vessels:     Appears normal (3
                   wks GA)                                  vessel cord)
 Face:             Appears normal         Kidneys:          Appear normal
                   (orbits and profile)
 Lips:             Appears normal         Bladder:          Appears normal
 Heart:            Appears normal         Spine:            Limited views
                   (4CH, axis, and                          appear normal
                   situs)
 RVOT:             Appears normal         Lower             Appears normal
                                          Extremities:
 LVOT:             Appears normal         Upper             Appears normal
                                          Extremities:

 Other:  Fetus appears to be a female. Heels visualized.
Cervix Uterus Adnexa

 Cervical Length:    4.37     cm

 Cervix:       Normal appearance by transabdominal scan.
 Uterus:       No abnormality visualized.
 Cul De Sac:   No free fluid seen.
 Left Ovary:    Within normal limits.
 Right Ovary:   Not visualized.
 Adnexa:     No abnormality visualized.
Impression

 Single IUP at 21w 2d
 Normal fetal anatomic survey
 No markers associated with aneuploidy noted
 Mild polyhydramnios is appreciated (max vertical pocket
 cm)
 A normal stomach bubble was appreciated
 Anterior placenta without previa
Recommendations

 Consider early screening OGTT due to polyhydramnios
 Recommend follow-up ultrasound examination in 4 weeks
 with MFM to reevalaute due to mild polyhydramnios

 questions or concerns.

## 2017-01-04 ENCOUNTER — Emergency Department (HOSPITAL_COMMUNITY)
Admission: EM | Admit: 2017-01-04 | Discharge: 2017-01-04 | Disposition: A | Discharge status: Home / Self Care | Payer: Self-pay | Attending: Emergency Medicine | Admitting: Emergency Medicine

## 2017-01-04 ENCOUNTER — Encounter (HOSPITAL_COMMUNITY): Payer: Self-pay

## 2017-01-04 ENCOUNTER — Encounter (HOSPITAL_COMMUNITY): Payer: Self-pay | Admitting: Emergency Medicine

## 2017-01-04 ENCOUNTER — Ambulatory Visit (HOSPITAL_COMMUNITY)
Admission: EM | Admit: 2017-01-04 | Discharge: 2017-01-04 | Disposition: A | Discharge status: Home / Self Care | Payer: Medicaid Other | Attending: Emergency Medicine | Admitting: Emergency Medicine

## 2017-01-04 DIAGNOSIS — R5383 Other fatigue: Secondary | ICD-10-CM

## 2017-01-04 DIAGNOSIS — R42 Dizziness and giddiness: Secondary | ICD-10-CM

## 2017-01-04 DIAGNOSIS — R110 Nausea: Secondary | ICD-10-CM

## 2017-01-04 DIAGNOSIS — Z79899 Other long term (current) drug therapy: Secondary | ICD-10-CM | POA: Insufficient documentation

## 2017-01-04 DIAGNOSIS — J45909 Unspecified asthma, uncomplicated: Secondary | ICD-10-CM | POA: Insufficient documentation

## 2017-01-04 DIAGNOSIS — J309 Allergic rhinitis, unspecified: Secondary | ICD-10-CM

## 2017-01-04 DIAGNOSIS — R202 Paresthesia of skin: Secondary | ICD-10-CM | POA: Insufficient documentation

## 2017-01-04 LAB — CBC WITH DIFFERENTIAL/PLATELET
Basophils Absolute: 0 10*3/uL (ref 0.0–0.1)
Basophils Relative: 0 %
Eosinophils Absolute: 0.1 10*3/uL (ref 0.0–0.7)
Eosinophils Relative: 1 %
HCT: 40.7 % (ref 36.0–46.0)
Hemoglobin: 13.9 g/dL (ref 12.0–15.0)
Lymphocytes Relative: 43 %
Lymphs Abs: 2.8 10*3/uL (ref 0.7–4.0)
MCH: 29.1 pg (ref 26.0–34.0)
MCHC: 34.2 g/dL (ref 30.0–36.0)
MCV: 85.1 fL (ref 78.0–100.0)
Monocytes Absolute: 0.4 10*3/uL (ref 0.1–1.0)
Monocytes Relative: 7 %
Neutro Abs: 3.1 10*3/uL (ref 1.7–7.7)
Neutrophils Relative %: 49 %
Platelets: 374 10*3/uL (ref 150–400)
RBC: 4.78 MIL/uL (ref 3.87–5.11)
RDW: 12.7 % (ref 11.5–15.5)
WBC: 6.4 10*3/uL (ref 4.0–10.5)

## 2017-01-04 LAB — POCT URINALYSIS DIP (DEVICE)
BILIRUBIN URINE: NEGATIVE
GLUCOSE, UA: NEGATIVE mg/dL
Hgb urine dipstick: NEGATIVE
KETONES UR: NEGATIVE mg/dL
LEUKOCYTES UA: NEGATIVE
Nitrite: NEGATIVE
Protein, ur: NEGATIVE mg/dL
SPECIFIC GRAVITY, URINE: 1.025 (ref 1.005–1.030)
UROBILINOGEN UA: 1 mg/dL (ref 0.0–1.0)
pH: 6.5 (ref 5.0–8.0)

## 2017-01-04 LAB — BASIC METABOLIC PANEL
Anion gap: 7 (ref 5–15)
BUN: 8 mg/dL (ref 6–20)
CO2: 28 mmol/L (ref 22–32)
Calcium: 9.3 mg/dL (ref 8.9–10.3)
Chloride: 104 mmol/L (ref 101–111)
Creatinine, Ser: 0.86 mg/dL (ref 0.44–1.00)
GFR calc Af Amer: >60 mL/min (ref >60)
GFR calc non Af Amer: >60 mL/min (ref >60)
Glucose, Bld: 109 mg/dL — ABNORMAL HIGH (ref 65–99)
Potassium: 3.5 mmol/L (ref 3.5–5.1)
Sodium: 139 mmol/L (ref 135–145)

## 2017-01-04 LAB — POCT PREGNANCY, URINE: Preg Test, Ur: NEGATIVE

## 2017-01-04 LAB — VITAMIN B12: Vitamin B-12: 515 pg/mL (ref 180–914)

## 2017-01-04 MED ORDER — IPRATROPIUM BROMIDE 0.06 % NA SOLN: 2.0000 | Freq: Four times a day (QID) | NASAL | 0 refills | Status: DC

## 2017-01-04 MED ORDER — ONDANSETRON 4 MG PO TBDP: 4.0000 mg | ORAL_TABLET | Freq: Three times a day (TID) | ORAL | 0 refills | Status: DC | PRN

## 2017-01-04 MED ORDER — MECLIZINE HCL 25 MG PO TABS: 25.0000 mg | ORAL_TABLET | Freq: Three times a day (TID) | ORAL | 0 refills | Status: DC | PRN

## 2017-01-04 NOTE — ED Provider Notes (Signed)
[facility]   [id number] 01/04/17 Arrival Time: 1039  ASSESSMENT & PLAN:  1. Other fatigue   2. Dizziness and giddiness   3. Nausea without vomiting   4. Allergic rhinitis, unspecified seasonality, unspecified trigger     Meds ordered this encounter  Medications  . ipratropium (ATROVENT) 0.06 % nasal spray    Sig: Place 2 sprays into both nostrils 4 (four) times daily.    Dispense:  15 mL    Refill:  0    Order Specific Question:   Supervising Provider    Answer:   Domenick Gong [[id number]]  . ondansetron (ZOFRAN ODT) 4 MG disintegrating tablet    Sig: Take 1 tablet (4 mg total) by mouth every 8 (eight) hours as needed for nausea or vomiting.    Dispense:  20 tablet    Refill:  0    Order Specific Question:   Supervising Provider    Answer:   Domenick Gong [[id number]]  . meclizine (ANTIVERT) 25 MG tablet    Sig: Take 1 tablet (25 mg total) by mouth 3 (three) times daily as needed for dizziness.    Dispense:  30 tablet    Refill:  0    Order Specific Question:   Supervising Provider    Answer:   Domenick Gong [[id number]]  Referral to [facility] for PCP.  Reviewed expectations re: course of current medical issues. Questions answered. Outlined signs and symptoms indicating need for more acute intervention. Patient verbalized understanding. After Visit Summary given.   SUBJECTIVE:  Kristy Miller is a 21 y.o. female who presents with complaint of dizziness, nausea, weakness, some dysuria, and generalized weakness today. She states she is under a lot of stress with being a single mother, full time work and full time school. She does not have PCP.  ROS: As per HPI.   OBJECTIVE:  Vitals:   01/04/17 1059 01/04/17 1104  BP: (!) 141/84   Pulse: 82   Resp: 16   Temp: 98.3 F (36.8 C)   TempSrc: Oral   SpO2: 100%   Weight: 120 lb (54.4 kg) 120 lb (54.4 kg)  Height: 5\' 4"  (1.626 m) 5\' 4"  (1.626 m)     General appearance: alert; no  distress Eyes: PERRLA; EOMI; nystagmus with EOMI  conjunctiva normal HENT: normocephalic; atraumatic; TMs normal; nasal mucosa normal; oral mucosa normal Neck: supple Lungs: clear to auscultation bilaterally Heart: regular rate and rhythm Abdomen: soft, non-tender; bowel sounds normal; no masses or organomegaly; no guarding or rebound tenderness Back: no CVA tenderness Extremities: no cyanosis or edema; symmetrical with no gross deformities Skin: warm and dry Neurologic: normal gait; normal symmetric reflexes Psychological: alert and cooperative; normal mood and affect  Past Medical History:  Diagnosis Date  . Asthma   . Eczema      has a past medical history of Asthma and Eczema.  Results for orders placed or performed during the hospital encounter of 01/04/17  POCT urinalysis dip (device)  Result Value Ref Range   Glucose, UA NEGATIVE NEGATIVE mg/dL   Bilirubin Urine NEGATIVE NEGATIVE   Ketones, ur NEGATIVE NEGATIVE mg/dL   Specific Gravity, Urine 1.025 1.005 - 1.030   Hgb urine dipstick NEGATIVE NEGATIVE   pH 6.5 5.0 - 8.0   Protein, ur NEGATIVE NEGATIVE mg/dL   Urobilinogen, UA 1.0 0.0 - 1.0 mg/dL   Nitrite NEGATIVE NEGATIVE   Leukocytes, UA NEGATIVE NEGATIVE  Pregnancy, urine POC  Result Value Ref Range   Preg Test,  Ur NEGATIVE NEGATIVE    Labs Reviewed  POCT URINALYSIS DIP (DEVICE)  POCT PREGNANCY, URINE    Imaging: No results found.  No Known Allergies  Family History  Problem Relation Age of Onset  . Adopted: Yes   Past Surgical History:  Procedure Laterality Date  . NO PAST SURGERIES    . OTHER SURGICAL HISTORY     age 39 , surgery on abd? not sure          Deatra Canter, [place] 01/04/17 1201

## 2017-01-04 NOTE — Discharge Instructions (Signed)
Please call [facility] for Follow up appointment.

## 2017-01-04 NOTE — ED Triage Notes (Signed)
PT reports her hands have been shaky. PT reports some dysuria for a few days. PT reports her feet are tingling and painful. PT reports she just started a new semester and a new job.

## 2017-01-04 NOTE — ED Triage Notes (Signed)
Pt reports she just returned to school and has only been getting 5 hours of sleep a night . Pt also reports she has not been drinking enough water . Pt  reports she has not been skipping meals.  Pt reports her hands have been shaky  And hands have felt numb.  Family member in room reported Pt also has had anemia in the past and asked if the PT hgb  Could be checked. Pt is also the parent of a new born.

## 2017-01-04 NOTE — Discharge Instructions (Signed)
Please read attached information. If you experience any new or worsening signs or symptoms please return to the emergency room for evaluation. Please follow-up with your primary care provider or specialist as discussed.  °

## 2017-01-04 NOTE — ED Triage Notes (Signed)
Pt presents from [facility] for evaluation of bilateral hand and feet numbness. Pt reports some dysuria as well, urinalysis at [facility] today. Pt reports some significant home stressors, Denies SI/HI. Denies hx of anxiety. Pt tearful in triage.

## 2017-01-04 NOTE — ED Provider Notes (Signed)
[facility]-EMERGENCY DEPT Provider Note   CSN: [id number] Arrival date & time: 01/04/17  1225     History   Chief Complaint Chief Complaint  Patient presents with  . Numbness    HPI Kristy Miller is a 21 y.o. female.  HPI   21 year old female presents today with complaints of paresthesias.  Patient notes that symptoms have been going over the last week .  She notes intermittent coming and going numbness and tingling in her bilateral upper and lower extremities and face.  Patient notes these come out of the blue and resolve after several hours without intervention.  Patient denies any aggravating or alleviating factors.  Patient notes that she has no other neurological deficits including weakness.  Patient denies any significant headaches.  Patient does note she is under significant stress as she is going to school, working several jobs, and has a young child at home.  Patient reports poor p.o. intake.  She denies any other physical complaints today.  Patient has very little information  from family history she was adopted    Past Medical History:  Diagnosis Date  . Asthma   . Eczema     Patient Active Problem List   Diagnosis Date Noted  . H/O polyhydramnios 12/10/2014  . History of gestational hypertension 12/10/2014  . Anemia 08/03/2014  . Chlamydia contact, treated 08/03/2014    Past Surgical History:  Procedure Laterality Date  . NO PAST SURGERIES    . OTHER SURGICAL HISTORY     age 54 , surgery on abd? not sure     OB History    Gravida Para Term Preterm AB Living   1 1 1     1    SAB TAB Ectopic Multiple Live Births         0 1       Home Medications    Prior to Admission medications   Medication Sig Start Date End Date Taking? Authorizing Provider  ipratropium (ATROVENT) 0.06 % nasal spray Place 2 sprays into both nostrils 4 (four) times daily. 01/04/17   Deatra Canter, FNP  meclizine (ANTIVERT) 25 MG tablet Take 1 tablet (25 mg total) by mouth 3 (three)  times daily as needed for dizziness. 01/04/17   Deatra Canter, FNP  metroNIDAZOLE (FLAGYL) 500 MG tablet Take 1 tablet (500 mg total) by mouth 2 (two) times daily. 12/11/14   Marny Lowenstein, PA-C  norethindrone (ORTHO MICRONOR) 0.35 MG tablet Take 1 tablet (0.35 mg total) by mouth daily. 12/10/14   Marny Lowenstein, PA-C  ondansetron (ZOFRAN ODT) 4 MG disintegrating tablet Take 1 tablet (4 mg total) by mouth every 8 (eight) hours as needed for nausea or vomiting. 01/04/17   Deatra Canter, FNP  Prenatal Vit-Fe Fumarate-FA (MULTIVITAMIN-PRENATAL) 27-0.8 MG TABS tablet Take 1 tablet by mouth daily at 12 noon.    [provider]    Family History Family History  Problem Relation Age of Onset  . Adopted: Yes    Social History Social History  Substance Use Topics  . Smoking status: Never Smoker  . Smokeless tobacco: Never Used  . Alcohol use Yes     Allergies   Patient has no known allergies.   Review of Systems Review of Systems  All other systems reviewed and are negative.    Physical Exam Updated Vital Signs BP 134/88 (BP Location: Left Arm)   Pulse 78   Temp 98.7 F (37.1 C) (Oral)   Resp 18  Ht 5\' 4"  (1.626 m)   Wt 54.4 kg (120 lb)   LMP 12/12/2016   SpO2 100%   BMI 20.60 kg/m   Physical Exam  Constitutional: She is oriented to person, place, and time. She appears well-developed and well-nourished.  HENT:  Head: Normocephalic and atraumatic.  Eyes: Pupils are equal, round, and reactive to light. Conjunctivae are normal. Right eye exhibits no discharge. Left eye exhibits no discharge. No scleral icterus.  Neck: Normal range of motion. No JVD present. No tracheal deviation present.  Pulmonary/Chest: Effort normal. No stridor.  Neurological: She is alert and oriented to person, place, and time. She has normal strength. No cranial nerve deficit or sensory deficit. Coordination normal. GCS eye subscore is 4. GCS verbal subscore is 5. GCS motor subscore is  6.  Psychiatric: She has a normal mood and affect. Her behavior is normal. Judgment and thought content normal.  Nursing note and vitals reviewed.   ED Treatments / Results  Labs (all labs ordered are listed, but only abnormal results are displayed) Labs Reviewed  BASIC METABOLIC PANEL - Abnormal; Notable for the following:       Result Value   Glucose, Bld 109 (*)    All other components within normal limits  CBC WITH DIFFERENTIAL/PLATELET  VITAMIN B12    EKG  EKG Interpretation None       Radiology No results found.  Procedures Procedures (including critical care time)  Medications Ordered in ED Medications - No data to display   Initial Impression / Assessment and Plan / ED Course  I have reviewed the triage vital signs and the nursing notes.  Pertinent labs & imaging results that were available during my care of the patient were reviewed by me and considered in my medical decision making (see chart for details).     Labs: CBC, BMP, B12  Imaging:  Consults:  Therapeutics:  Discharge Meds:    Assessment/Plan: 21 year old female presents today with paresthesias.  Patient has normal neurological exam here, she has no abnormal sensations now.  She has reassuring laboratory analysis.  Uncertain etiology of her symptoms.  Patient is under significant stress.  She will follow-up as an outpatient with primary care for reevaluation further management.  Patient verbalized understanding and agreement to today's plan had no further questions or concerns.   Final Clinical Impressions(s) / ED Diagnoses   Final diagnoses:  Paresthesia    New Prescriptions Discharge Medication List as of 01/04/2017  8:07 PM       Rosalio Loud 01/04/17 2106    Benjiman Core, MD 01/06/17 [phone]

## 2017-01-04 NOTE — ED Notes (Signed)
See EDP assessment 

## 2017-04-19 ENCOUNTER — Encounter (HOSPITAL_COMMUNITY): Payer: Self-pay | Admitting: *Deleted

## 2017-04-19 ENCOUNTER — Emergency Department (HOSPITAL_COMMUNITY): Payer: BLUE CROSS/BLUE SHIELD

## 2017-04-19 ENCOUNTER — Emergency Department (HOSPITAL_COMMUNITY)
Admission: EM | Admit: 2017-04-19 | Discharge: 2017-04-19 | Disposition: A | Discharge status: Home / Self Care | Payer: BLUE CROSS/BLUE SHIELD | Attending: Physician Assistant | Admitting: Physician Assistant

## 2017-04-19 ENCOUNTER — Other Ambulatory Visit: Payer: Self-pay

## 2017-04-19 DIAGNOSIS — J45909 Unspecified asthma, uncomplicated: Secondary | ICD-10-CM | POA: Diagnosis not present

## 2017-04-19 DIAGNOSIS — R0781 Pleurodynia: Secondary | ICD-10-CM | POA: Insufficient documentation

## 2017-04-19 DIAGNOSIS — R109 Unspecified abdominal pain: Secondary | ICD-10-CM | POA: Diagnosis present

## 2017-04-19 DIAGNOSIS — Z79899 Other long term (current) drug therapy: Secondary | ICD-10-CM | POA: Diagnosis not present

## 2017-04-19 LAB — URINALYSIS, ROUTINE W REFLEX MICROSCOPIC
Bilirubin Urine: NEGATIVE
GLUCOSE, UA: NEGATIVE mg/dL
Hgb urine dipstick: NEGATIVE
KETONES UR: NEGATIVE mg/dL
LEUKOCYTES UA: NEGATIVE
Nitrite: NEGATIVE
PH: 6 (ref 5.0–8.0)
PROTEIN: NEGATIVE mg/dL
Specific Gravity, Urine: 1.023 (ref 1.005–1.030)

## 2017-04-19 LAB — PREGNANCY, URINE: Preg Test, Ur: NEGATIVE

## 2017-04-19 MED ORDER — KETOROLAC TROMETHAMINE 30 MG/ML IJ SOLN
30.0000 mg | Freq: Once | INTRAMUSCULAR | Status: DC
  Filled 2017-04-19: qty 1

## 2017-04-19 MED ORDER — NAPROXEN 500 MG PO TABS
500.0000 mg | ORAL_TABLET | Freq: Two times a day (BID) | ORAL | 0 refills | Status: DC
Start: 2017-04-19 — End: 2021-02-24

## 2017-04-19 MED ORDER — KETOROLAC TROMETHAMINE 30 MG/ML IJ SOLN
30.0000 mg | Freq: Once | INTRAMUSCULAR | Status: AC
  Administered 2017-04-19: 30 mg via INTRAMUSCULAR

## 2017-04-19 NOTE — ED Triage Notes (Signed)
To ED for eval of right side pain with deep breath. No pain with palpation. Appears in nad. Denies sob

## 2017-04-19 NOTE — Discharge Instructions (Signed)
Please see the information and instructions below regarding your visit.  Your diagnoses today include:  1. Pleuritic pain    Your examination and testing is reassuring today.  Your chest x-ray and EKG are normal.  We reviewed all of your risk factors We reviewed decision making rules for pulmonary embolism (blood clot) we are reassured that you do not have any risk factors for pulmonary embolism.  We do want you to be in tune to your symptoms to make sure that you are assessing whether you have worsening shortness of breath, worsening pain, dizziness, or lightheadedness, or feeling like you are going to pass out.  Tests performed today include: See side panel of your discharge paperwork for testing performed today. Vital signs are listed at the bottom of these instructions.   Chest x-ray, rib x-ray, and EKG.  Medications prescribed:    Take any prescribed medications only as prescribed, and any over the counter medications only as directed on the packaging.  You are prescribed Naproxen, a non-steroidal anti-inflammatory agent (NSAID) for pain. You may take 500 mg every 12 hours as needed for pain. If still requiring this medication around the clock for acute pain after 10 days, please see your primary healthcare provider.  You may combine this medication with Tylenol, 650 mg every 6 hours, so you are receiving something for pain every 3 hours.  This is not a long-term medication unless under the care and direction of your primary provider. Taking this medication long-term and not under the supervision of a healthcare provider could increase the risk of stomach ulcers, kidney problems, and cardiovascular problems such as high blood pressure.    Home care instructions:  Please follow any educational materials contained in this packet.   Follow-up instructions: Please follow-up with your primary care provider in as soon as possible for further evaluation of your symptoms if they are not  completely improved.   Return instructions:  Please return to the Emergency Department if you experience worsening symptoms.  Please return to the emergency department for any worsening chest pain, spreading chest pain, shortness of breath with activity or at rest, or dizziness or lightheadedness or feeling like you are going to pass out. Please return if you have any other emergent concerns.  Additional Information: To find a primary care or specialty doctor please call [phone] or [phone] to access "[facility] Find a Doctor Service."  You may also go on the [facility] website at [email or web address]  There are also multiple [facility] and [facility] practices throughout the [place] that are frequently accepting new patients. You may find a clinic that is close to your home and contact them.  [facility] - [address] [place] [phone]  [facility] in [place] (also locations in [place] and [place]) - [address] [place] [phone]  [facility] - [address] [place] [phone]    Your vital signs today were: BP (!) 138/96 (BP Location: Left Arm)    Pulse 70    Temp 98.3 F (36.8 C) (Oral)    Resp 16    LMP 04/08/2017 (Approximate)    SpO2 100%  If your blood pressure (BP) was elevated on multiple readings during this visit above 130 for the top number or above 80 for the bottom number, please have this repeated by your primary care provider within one month. --------------  Thank you for allowing us to participate in your care today.

## 2017-04-19 NOTE — ED Provider Notes (Signed)
[facility] EMERGENCY DEPARTMENT Provider Note   CSN: [id number] Arrival date & time: 04/19/17  1051     History   Chief Complaint Chief Complaint  Patient presents with  . Flank Pain    HPI Kristy Miller is a 21 y.o. female.  HPI   Patient is a 21 year old female with a history of asthma (as a child) and eczema presenting for 1.5 weeks of pain in the right lateral side and flank with deep breathing.  Patient reports that this pain is sharp and lasts for a moment.  It only occurs with deep breathing, and it is particularly noticeable when she is lying flat or standing.  When she takes a deep breath while she is sitting, it does not appear.  Patient has not had any fever or chills.  No cough.  No hemoptysis.  No preceding upper respiratory infection.  Patient has not had any abdominal pain, nausea, or vomiting.  Patient does not feel the pain in her right upper quadrant after eating fatty foods.  The pain is not colicky in nature or in the flank.  No recent trauma.  Patient does have a 22-year-old that she lifts frequently, however she does not recall any episodes of straining her abdominal or thoracic musculature.  Patient denies any unilateral leg swelling or calf tenderness, recent immobilization for surgery, travel, or hospitalization, personal history of DVT/PE, history of cancer, or estrogen use.  Past Medical History:  Diagnosis Date  . Asthma   . Eczema     Patient Active Problem List   Diagnosis Date Noted  . H/O polyhydramnios 12/10/2014  . History of gestational hypertension 12/10/2014  . Anemia 08/03/2014  . Chlamydia contact, treated 08/03/2014    Past Surgical History:  Procedure Laterality Date  . NO PAST SURGERIES    . OTHER SURGICAL HISTORY     age 89 , surgery on abd? not sure     OB History    Gravida Para Term Preterm AB Living   1 1 1     1    SAB TAB Ectopic Multiple Live Births         0 1       Home Medications    Prior to  Admission medications   Medication Sig Start Date End Date Taking? Authorizing Provider  ipratropium (ATROVENT) 0.06 % nasal spray Place 2 sprays into both nostrils 4 (four) times daily. 01/04/17   Deatra Canterxford, William J, FNP  meclizine (ANTIVERT) 25 MG tablet Take 1 tablet (25 mg total) by mouth 3 (three) times daily as needed for dizziness. 01/04/17   Deatra Canterxford, William J, FNP  metroNIDAZOLE (FLAGYL) 500 MG tablet Take 1 tablet (500 mg total) by mouth 2 (two) times daily. 12/11/14   Marny LowensteinWenzel, Julie N, PA-C  norethindrone (ORTHO MICRONOR) 0.35 MG tablet Take 1 tablet (0.35 mg total) by mouth daily. 12/10/14   Marny LowensteinWenzel, Julie N, PA-C  ondansetron (ZOFRAN ODT) 4 MG disintegrating tablet Take 1 tablet (4 mg total) by mouth every 8 (eight) hours as needed for nausea or vomiting. 01/04/17   Deatra Canterxford, William J, FNP  Prenatal Vit-Fe Fumarate-FA (MULTIVITAMIN-PRENATAL) 27-0.8 MG TABS tablet Take 1 tablet by mouth daily at 12 noon.    [provider]    Family History Family History  Adopted: Yes    Social History Social History   Tobacco Use  . Smoking status: Never Smoker  . Smokeless tobacco: Never Used  Substance Use Topics  . Alcohol use: Yes  .  Drug use: No     Allergies   Patient has no known allergies.   Review of Systems Review of Systems  Constitutional: Negative for chills and fever.  HENT: Negative for congestion and rhinorrhea.   Respiratory: Negative for shortness of breath and wheezing.        + Pleuritic pain  Cardiovascular: Negative for chest pain.  Gastrointestinal: Negative for abdominal pain, nausea and vomiting.  Genitourinary: Negative for flank pain.  All other systems reviewed and are negative.    Physical Exam Updated Vital Signs BP (!) 128/102 (BP Location: Right Arm)   Pulse 67   Temp 98.3 F (36.8 C) (Oral)   Resp 16   LMP 04/08/2017 (Approximate)   SpO2 100%   Physical Exam  Constitutional: She appears well-developed and well-nourished. No distress.   HENT:  Head: Normocephalic and atraumatic.  Mouth/Throat: Oropharynx is clear and moist.  Eyes: Conjunctivae and EOM are normal. Pupils are equal, round, and reactive to light.  Neck: Normal range of motion. Neck supple.  Cardiovascular: Normal rate, regular rhythm, S1 normal and S2 normal.  No murmur heard. There is no lower extremity edema or calf tenderness.  2+ DP pulses bilaterally.  Pulmonary/Chest: Effort normal and breath sounds normal. She has no wheezes. She has no rales.  The pain is not reproducible on palpation of the right flank and right lateral ribs, but able to reproduce with deep breathing in a lying position.  Abdominal: Soft. She exhibits no distension. There is no tenderness. There is no guarding.  Negative Murphy sign.  Musculoskeletal: Normal range of motion. She exhibits no edema or deformity.  Lymphadenopathy:    She has no cervical adenopathy.  Neurological: She is alert.  Cranial nerves grossly intact. Patient was extremities symmetrically and with good coordination.  Skin: Skin is warm and dry. No rash noted. No erythema.  Psychiatric: She has a normal mood and affect. Her behavior is normal. Judgment and thought content normal.  Nursing note and vitals reviewed.    ED Treatments / Results  Labs (all labs ordered are listed, but only abnormal results are displayed) Labs Reviewed  URINALYSIS, ROUTINE W REFLEX MICROSCOPIC  PREGNANCY, URINE    EKG  EKG Interpretation None       Radiology No results found.  Procedures Procedures (including critical care time)  Medications Ordered in ED Medications - No data to display   Initial Impression / Assessment and Plan / ED Course  I have reviewed the triage vital signs and the nursing notes.  Pertinent labs & imaging results that were available during my care of the patient were reviewed by me and considered in my medical decision making (see chart for details).     Final Clinical  Impressions(s) / ED Diagnoses   Final diagnoses:  Pleuritic pain   Patient is nontoxic-appearing and in no acute distress.  Patient is only able to elicit symptoms with deep breathing while lying flat on my evaluation.  I have low suspicion that this is a pulmonary embolism, as patient is a non-smoker and Wells score is 0 and PERC negative.  Given the time course of symptoms, it is also unlikely that this is pulmonary embolism.  I believe there may be some pleural or diaphragmatic irritation and is amenable to anti-inflammatory treatment.  I engaged in shared decision making with this patient regarding the utility of decision making rules for pulmonary embolism as well as the risks and benefits of proceeding with testing for pulmonary embolism  in the setting of no risk factors.  Patient did not wish to receive a CT scan if the test was positive due to concern over radiation risk, and preferred watchful waiting at this time.  I gave her explicit return precautions for any worsening shortness of breath, worsening pain, migratory pleuritic pain, dizziness, lightheadedness, or presyncope in the setting of her symptoms.  Patient was in understanding of these were precautions and is agreeable to the plan of care.  ED Discharge Orders    None       Delia ChimesMurray, Karry Causer B, PA-C 04/19/17 1825    Abelino DerrickMackuen, Courteney Lyn, MD 04/22/17 [phone]

## 2017-04-19 NOTE — ED Notes (Signed)
Patient transported to X-ray 

## 2017-05-17 ENCOUNTER — Encounter (HOSPITAL_COMMUNITY): Payer: Self-pay | Admitting: Emergency Medicine

## 2017-05-17 ENCOUNTER — Emergency Department (HOSPITAL_COMMUNITY)
Admission: EM | Admit: 2017-05-17 | Discharge: 2017-05-17 | Disposition: A | Discharge status: Home / Self Care | Payer: BLUE CROSS/BLUE SHIELD | Attending: Emergency Medicine | Admitting: Emergency Medicine

## 2017-05-17 DIAGNOSIS — J45909 Unspecified asthma, uncomplicated: Secondary | ICD-10-CM | POA: Insufficient documentation

## 2017-05-17 DIAGNOSIS — H109 Unspecified conjunctivitis: Secondary | ICD-10-CM

## 2017-05-17 DIAGNOSIS — H1089 Other conjunctivitis: Secondary | ICD-10-CM | POA: Insufficient documentation

## 2017-05-17 DIAGNOSIS — H11431 Conjunctival hyperemia, right eye: Secondary | ICD-10-CM | POA: Diagnosis present

## 2017-05-17 DIAGNOSIS — Z79899 Other long term (current) drug therapy: Secondary | ICD-10-CM | POA: Insufficient documentation

## 2017-05-17 MED ORDER — TETRACAINE HCL 0.5 % OP SOLN
1.0000 [drp] | Freq: Once | OPHTHALMIC | Status: AC
  Administered 2017-05-17: 1 [drp] via OPHTHALMIC
  Filled 2017-05-17: qty 4

## 2017-05-17 MED ORDER — FLUORESCEIN SODIUM 1 MG OP STRP
1.0000 | ORAL_STRIP | Freq: Once | OPHTHALMIC | Status: AC
  Administered 2017-05-17: 1 via OPHTHALMIC
  Filled 2017-05-17: qty 1

## 2017-05-17 MED ORDER — ERYTHROMYCIN 5 MG/GM OP OINT
TOPICAL_OINTMENT | Freq: Once | OPHTHALMIC | Status: AC
  Administered 2017-05-17: 1 via OPHTHALMIC
  Filled 2017-05-17: qty 3.5

## 2017-05-17 NOTE — ED Triage Notes (Signed)
Patient c/o right eye drainage and crust since Sunday. Patient reports when blinks is sore and some blurred vision when eye is full of drainage.

## 2017-05-17 NOTE — Discharge Instructions (Signed)
Use the eye ointment 3 times a day for the next week. Follow up with the eye doctor. Return here a needed.

## 2017-05-17 NOTE — ED Provider Notes (Signed)
[facility]   CSN: [id number] Arrival date & time: 05/17/17  1851     History   Chief Complaint Chief Complaint  Patient [id number] with  . Conjunctivitis    HPI Kristy Miller is a 22 y.o. female who presents to the ED with right eye redness and itching. The symptoms started 4 days ago and have gotten worse with crusty drainage on the eye lids. Sometimes blurry vision. Patient reports that a guy at work had pink eye last week.  HPI  Past Medical History:  Diagnosis Date  . Asthma   . Eczema     Patient Active Problem List   Diagnosis Date Noted  . H/O polyhydramnios 12/10/2014  . History of gestational hypertension 12/10/2014  . Anemia 08/03/2014  . Chlamydia contact, treated 08/03/2014    Past Surgical History:  Procedure Laterality Date  . NO PAST SURGERIES    . OTHER SURGICAL HISTORY     age 89 , surgery on abd? not sure     OB History    Gravida Para Term Preterm AB Living   1 1 1     1    SAB TAB Ectopic Multiple Live Births         0 1       Home Medications    Prior to Admission medications   Medication Sig Start Date End Date Taking? Authorizing Provider  ipratropium (ATROVENT) 0.06 % nasal spray Place 2 sprays into both nostrils 4 (four) times daily. 01/04/17   Deatra Canterxford, William J, FNP  meclizine (ANTIVERT) 25 MG tablet Take 1 tablet (25 mg total) by mouth 3 (three) times daily as needed for dizziness. 01/04/17   Deatra Canterxford, William J, FNP  metroNIDAZOLE (FLAGYL) 500 MG tablet Take 1 tablet (500 mg total) by mouth 2 (two) times daily. 12/11/14   Marny LowensteinWenzel, Julie N, PA-C  naproxen (NAPROSYN) 500 MG tablet Take 1 tablet (500 mg total) by mouth 2 (two) times daily. 04/19/17   Aviva KluverMurray, Alyssa B, PA-C  norethindrone (ORTHO MICRONOR) 0.35 MG tablet Take 1 tablet (0.35 mg total) by mouth daily. 12/10/14   Marny LowensteinWenzel, Julie N, PA-C  ondansetron (ZOFRAN ODT) 4 MG disintegrating tablet Take 1 tablet (4 mg total) by mouth every 8  (eight) hours as needed for nausea or vomiting. 01/04/17   Deatra Canterxford, William J, FNP  Prenatal Vit-Fe Fumarate-FA (MULTIVITAMIN-PRENATAL) 27-0.8 MG TABS tablet Take 1 tablet by mouth daily at 12 noon.    [provider]    Family History Family History  Adopted: Yes    Social History Social History   Tobacco Use  . Smoking status: Never Smoker  . Smokeless tobacco: Never Used  Substance Use Topics  . Alcohol use: Yes  . Drug use: No     Allergies   Patient has no known allergies.   Review of Systems Review of Systems  Eyes: Positive for photophobia, discharge, redness and itching.  All other systems reviewed and are negative.    Physical Exam Updated Vital Signs BP 137/77 (BP Location: Left Arm)   Pulse 81   Temp 98.4 F (36.9 C) (Oral)   Resp 16   SpO2 100%   Physical Exam  Constitutional: She appears well-developed and well-nourished. No distress.  HENT:  Head: Normocephalic.  Eyes: EOM are normal. Pupils are equal, round, and reactive to light. Right eye exhibits discharge. Right eye exhibits no hordeolum. No foreign body present in the right eye. Right conjunctiva is injected.  Fundoscopic  exam:      The right eye shows exudate.  Slit lamp exam:      The right eye shows no corneal abrasion, no corneal ulcer, no foreign body and no fluorescein uptake.  Neck: Neck supple.  Cardiovascular: Normal rate.  Pulmonary/Chest: Effort normal.  Musculoskeletal: Normal range of motion.  Lymphadenopathy:    She has no cervical adenopathy.  Neurological: She is alert. No cranial nerve deficit.  Skin: Skin is warm and dry.  Psychiatric: She has a normal mood and affect. Her behavior is normal.  Nursing note and vitals reviewed.    ED Treatments / Results  Labs (all labs ordered are listed, but only abnormal results are displayed) Labs Reviewed - No data to display  Radiology No results found.  Procedures Procedures (including critical care  time)  Medications Ordered in ED Medications  erythromycin ophthalmic ointment (not administered)  tetracaine (PONTOCAINE) 0.5 % ophthalmic solution 1 drop (1 drop Right Eye Given 05/17/17 2005)  fluorescein ophthalmic strip 1 strip (1 strip Right Eye Given 05/17/17 2005)     Initial Impression / Assessment and Plan / ED Course  I have reviewed the triage vital signs and the nursing notes. Kristy Miller presents with symptoms consistent with bacterial conjunctivitis.  Purulent discharge on exam.  No corneal abrasions, entrapment, consensual photophobia, or dendritic staining with fluorescein study.  Presentation non-concerning for iritis, corneal abrasions, or HSV.  No evidence of preseptal or orbital cellulitis.  Pt is not a contact lens wearer.  Patient will be given erythromycin ophthalmic.  Personal hygiene and frequent handwashing discussed.  Patient advised to followup with ophthalmologist for reevaluation in several days..  Patient verbalizes understanding and is agreeable with discharge.     Final Clinical Impressions(s) / ED Diagnoses   Final diagnoses:  Bacterial conjunctivitis of right eye    ED Discharge Orders    None       Kerrie Buffalo [place], [place] 05/17/17 2034    Linwood Dibbles, MD 05/17/17 2310

## 2017-07-06 ENCOUNTER — Encounter: Payer: Self-pay | Admitting: *Deleted

## 2021-01-26 ENCOUNTER — Other Ambulatory Visit: Payer: Self-pay

## 2021-01-26 ENCOUNTER — Encounter (HOSPITAL_BASED_OUTPATIENT_CLINIC_OR_DEPARTMENT_OTHER): Payer: Self-pay | Admitting: *Deleted

## 2021-01-26 DIAGNOSIS — S0990XA Unspecified injury of head, initial encounter: Secondary | ICD-10-CM | POA: Diagnosis present

## 2021-01-26 DIAGNOSIS — J45909 Unspecified asthma, uncomplicated: Secondary | ICD-10-CM | POA: Diagnosis not present

## 2021-01-26 DIAGNOSIS — Y92410 Unspecified street and highway as the place of occurrence of the external cause: Secondary | ICD-10-CM | POA: Insufficient documentation

## 2021-01-26 DIAGNOSIS — S060X0A Concussion without loss of consciousness, initial encounter: Secondary | ICD-10-CM | POA: Diagnosis not present
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
## 2021-01-27 ENCOUNTER — Emergency Department (HOSPITAL_BASED_OUTPATIENT_CLINIC_OR_DEPARTMENT_OTHER)
Admission: EM | Admit: 2021-01-27 | Discharge: 2021-01-27 | Disposition: A | Discharge status: Home / Self Care | Payer: Medicaid Other | Attending: Emergency Medicine | Admitting: Emergency Medicine

## 2021-01-27 DIAGNOSIS — S060X0A Concussion without loss of consciousness, initial encounter: Secondary | ICD-10-CM
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
## 2021-02-24 ENCOUNTER — Ambulatory Visit
Admission: EM | Admit: 2021-02-24 | Discharge: 2021-02-24 | Disposition: A | Discharge status: Home / Self Care | Payer: Medicaid Other | Attending: Emergency Medicine | Admitting: Emergency Medicine

## 2021-02-24 ENCOUNTER — Encounter: Payer: Self-pay | Admitting: Emergency Medicine

## 2021-02-24 ENCOUNTER — Other Ambulatory Visit: Payer: Self-pay

## 2021-02-24 DIAGNOSIS — Z3202 Encounter for pregnancy test, result negative: Secondary | ICD-10-CM

## 2021-02-24 DIAGNOSIS — M5430 Sciatica, unspecified side: Secondary | ICD-10-CM | POA: Diagnosis not present

## 2021-02-24 LAB — POCT URINALYSIS DIP (MANUAL ENTRY)
Bilirubin, UA: NEGATIVE
Blood, UA: NEGATIVE
Glucose, UA: NEGATIVE mg/dL
Ketones, POC UA: NEGATIVE mg/dL
Leukocytes, UA: NEGATIVE
Nitrite, UA: NEGATIVE
Protein Ur, POC: 100 mg/dL — AB
Spec Grav, UA: 1.02 (ref 1.010–1.025)
Urobilinogen, UA: 0.2 E.U./dL
pH, UA: 5.5 (ref 5.0–8.0)

## 2021-02-24 LAB — POCT URINE PREGNANCY: Preg Test, Ur: NEGATIVE

## 2021-02-24 MED ORDER — PRENATAL 27-0.8 MG PO TABS: 1.0000 | ORAL_TABLET | Freq: Every day | ORAL | 3 refills | Status: AC

## 2021-02-24 MED ORDER — IBUPROFEN 600 MG PO TABS: 600.0000 mg | ORAL_TABLET | Freq: Three times a day (TID) | ORAL | 0 refills | Status: DC | PRN

## 2021-02-24 MED ORDER — FAMOTIDINE 20 MG PO TABS: 20.0000 mg | ORAL_TABLET | Freq: Two times a day (BID) | ORAL | 0 refills | Status: DC

## 2021-02-24 MED ORDER — METHYLPREDNISOLONE 4 MG PO TBPK: ORAL_TABLET | ORAL | 0 refills | Status: DC

## 2021-02-24 NOTE — ED Triage Notes (Signed)
Pt states she fell down her indoor stairs on Saturday. She has been taking ibuprofen for back pain since then but states pain is getting worse.

## 2021-02-24 NOTE — Discharge Instructions (Addendum)
Please begin Medrol Dosepak to calm down the pinched nerve in your lower back that is causing numbness in your toes.  I have also provided you with a prescription for ibuprofen and a lower dose that is a little safer to take 3 times daily.  Begin to have a little bit of an upset stomach after taking the ibuprofen, or the steroids, I have also prescribed you a heartburn medicine called famotidine, also known as Pepcid, that you can take twice daily as needed for heartburn symptoms.  Your pregnancy test was negative today.  I renewed your prescription for prenatal vitamins just in case, if you are planning on getting pregnant is a good idea to already be on them.  This is the contact information for a local chiropractor that I believe would be helpful for you, as we discussed, he will perform an x-ray at your first visit so that you can see firsthand what might be going on with your back. [email or web address] [address] [place], [place] [address] [phone]
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
## 2021-03-23 ENCOUNTER — Emergency Department (HOSPITAL_BASED_OUTPATIENT_CLINIC_OR_DEPARTMENT_OTHER)
Admission: EM | Admit: 2021-03-23 | Discharge: 2021-03-23 | Disposition: A | Discharge status: Home / Self Care | Payer: Medicaid Other | Attending: Emergency Medicine | Admitting: Emergency Medicine

## 2021-03-23 ENCOUNTER — Other Ambulatory Visit: Payer: Self-pay

## 2021-03-23 ENCOUNTER — Encounter (HOSPITAL_BASED_OUTPATIENT_CLINIC_OR_DEPARTMENT_OTHER): Payer: Self-pay | Admitting: Emergency Medicine

## 2021-03-23 DIAGNOSIS — F32A Depression, unspecified: Secondary | ICD-10-CM | POA: Insufficient documentation

## 2021-03-23 DIAGNOSIS — U071 COVID-19: Secondary | ICD-10-CM | POA: Insufficient documentation

## 2021-03-23 LAB — COMPREHENSIVE METABOLIC PANEL
ALT: 13 U/L (ref 0–44)
AST: 17 U/L (ref 15–41)
Albumin: 4.5 g/dL (ref 3.5–5.0)
Alkaline Phosphatase: 49 U/L (ref 38–126)
Anion gap: 8 (ref 5–15)
BUN: 12 mg/dL (ref 6–20)
CO2: 26 mmol/L (ref 22–32)
Calcium: 9.1 mg/dL (ref 8.9–10.3)
Chloride: 103 mmol/L (ref 98–111)
Creatinine, Ser: 0.75 mg/dL (ref 0.44–1.00)
GFR, Estimated: >60 mL/min (ref >60)
Glucose, Bld: 98 mg/dL (ref 70–99)
Potassium: 3.6 mmol/L (ref 3.5–5.1)
Sodium: 137 mmol/L (ref 135–145)
Total Bilirubin: 0.7 mg/dL (ref 0.3–1.2)
Total Protein: 7.9 g/dL (ref 6.5–8.1)

## 2021-03-23 LAB — CBC WITH DIFFERENTIAL/PLATELET
Abs Immature Granulocytes: 0.01 10*3/uL (ref 0.00–0.07)
Basophils Absolute: 0 10*3/uL (ref 0.0–0.1)
Basophils Relative: 1 %
Eosinophils Absolute: 0.1 10*3/uL (ref 0.0–0.5)
Eosinophils Relative: 3 %
HCT: 40.5 % (ref 36.0–46.0)
Hemoglobin: 13.8 g/dL (ref 12.0–15.0)
Immature Granulocytes: 0 %
Lymphocytes Relative: 48 %
Lymphs Abs: 1.9 10*3/uL (ref 0.7–4.0)
MCH: 30.4 pg (ref 26.0–34.0)
MCHC: 34.1 g/dL (ref 30.0–36.0)
MCV: 89.2 fL (ref 80.0–100.0)
Monocytes Absolute: 0.2 10*3/uL (ref 0.1–1.0)
Monocytes Relative: 6 %
Neutro Abs: 1.7 10*3/uL (ref 1.7–7.7)
Neutrophils Relative %: 42 %
Platelets: 323 10*3/uL (ref 150–400)
RBC: 4.54 MIL/uL (ref 3.87–5.11)
RDW: 12.5 % (ref 11.5–15.5)
WBC: 3.9 10*3/uL — ABNORMAL LOW (ref 4.0–10.5)
nRBC: 0 % (ref 0.0–0.2)

## 2021-03-23 LAB — PREGNANCY, URINE: Preg Test, Ur: NEGATIVE

## 2021-03-23 LAB — RESP PANEL BY RT-PCR (FLU A&B, COVID) ARPGX2
Influenza A by PCR: NEGATIVE
Influenza B by PCR: NEGATIVE
SARS Coronavirus 2 by RT PCR: POSITIVE — AB

## 2021-03-23 LAB — RAPID URINE DRUG SCREEN, HOSP PERFORMED
Amphetamines: NOT DETECTED
Barbiturates: NOT DETECTED
Benzodiazepines: NOT DETECTED
Cocaine: NOT DETECTED
Opiates: NOT DETECTED
Tetrahydrocannabinol: POSITIVE — AB

## 2021-03-23 NOTE — ED Notes (Signed)
ED Provider at bedside. 
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
## 2021-03-23 NOTE — ED Triage Notes (Addendum)
Pt reports anxiety/depression that has been worsening since June; she has not been on sertraline since 2020 (took herself off); seeing a therapist, but therapist has cancelled last 4 appointments; +SI, but no plan; denies HI; also would like a pregnancy test

## 2021-03-23 NOTE — ED Notes (Signed)
TTS called by this RN to evaluate time, advise that patient should be next to be evaluated by TTS. Delay explained to patient.

## 2021-03-23 NOTE — ED Provider Notes (Signed)
[place] EMERGENCY DEPARTMENT Provider Note   CSN: [id number] Arrival date & time: 03/23/21  0955     History Chief Complaint  Patient presents with   Anxiety   Depression    Kristy Miller is a 25 y.o. female.  Presented to the ER with concern for depression.  Patient states that she has a long history of anxiety and depression and was previously on sertraline.  Has been off for couple years.  Over the past couple months states that she has been going through a lot of major life stressors and has had increase in her depression.  Occasionally has suicidal thoughts but does not have any plan.  Does not want to actually carry anything out.  Is not currently on any psychiatric medications.  No hallucinations.  She denies any recent illnesses, no cough or congestion.  HPI     Past Medical History:  Diagnosis Date   Asthma    Eczema     Patient Active Problem List   Diagnosis Date Noted   H/O polyhydramnios 12/10/2014   History of gestational hypertension 12/10/2014   Anemia 08/03/2014   Chlamydia contact, treated 08/03/2014    Past Surgical History:  Procedure Laterality Date   NO PAST SURGERIES     OTHER SURGICAL HISTORY     age 2 , surgery on abd? not sure      OB History     Gravida  1   Para  1   Term  1   Preterm      AB      Living  1      SAB      IAB      Ectopic      Multiple  0   Live Births  1           Family History  Adopted: Yes    Social History   Tobacco Use   Smoking status: Some Days    Types: Cigarettes   Smokeless tobacco: Never  Vaping Use   Vaping Use: Never used  Substance Use Topics   Alcohol use: Yes    Comment: social   Drug use: Yes    Types: Marijuana    Comment: occasional    Home Medications Prior to Admission medications   Medication Sig Start Date End Date Taking? Authorizing Provider  famotidine (PEPCID) 20 MG tablet Take 1 tablet (20 mg total) by mouth 2 (two) times daily.  02/24/21   Lynden Oxford Scales, PA-C  ibuprofen (ADVIL) 600 MG tablet Take 1 tablet (600 mg total) by mouth every 8 (eight) hours as needed for moderate pain. 02/24/21   Lynden Oxford Scales, PA-C  methylPREDNISolone (MEDROL DOSEPAK) 4 MG TBPK tablet Take 24 mg on day 1, 20 mg on day 2, 16 mg on day 3, 12 mg on day 4, 8 mg on day 5, 4 mg on day 6. 02/24/21   Lynden Oxford Scales, PA-C  Prenatal Vit-Fe Fumarate-FA (MULTIVITAMIN-PRENATAL) 27-0.8 MG TABS tablet Take 1 tablet by mouth daily at 12 noon. 02/24/21 02/19/22  Lynden Oxford Scales, PA-C    Allergies    Patient has no known allergies.  Review of Systems   Review of Systems  Constitutional:  Negative for chills and fever.  HENT:  Negative for ear pain and sore throat.   Eyes:  Negative for pain and visual disturbance.  Respiratory:  Negative for cough and shortness of breath.   Cardiovascular:  Negative for chest pain and  palpitations.  Gastrointestinal:  Negative for abdominal pain and vomiting.  Genitourinary:  Negative for dysuria and hematuria.  Musculoskeletal:  Negative for arthralgias and back pain.  Skin:  Negative for color change and rash.  Neurological:  Negative for seizures and syncope.  Psychiatric/Behavioral:  Positive for suicidal ideas. Negative for self-injury. The patient is nervous/anxious.   All other systems reviewed and are negative.  Physical Exam Updated Vital Signs BP (!) 126/96 (BP Location: Right Arm)   Pulse (!) 57   Temp 98.1 F (36.7 C) (Oral)   Resp 14   Ht 5\' 4"  (1.626 m)   Wt 56.7 kg   LMP 01/17/2021 (Approximate)   SpO2 100%   BMI 21.46 kg/m   Physical Exam Vitals and nursing note reviewed.  Constitutional:      General: She is not in acute distress.    Appearance: She is well-developed.  HENT:     Head: Normocephalic and atraumatic.  Eyes:     Conjunctiva/sclera: Conjunctivae normal.  Cardiovascular:     Rate and Rhythm: Normal rate and regular rhythm.     Heart sounds:  No murmur heard. Pulmonary:     Effort: Pulmonary effort is normal. No respiratory distress.     Breath sounds: Normal breath sounds.  Abdominal:     Palpations: Abdomen is soft.     Tenderness: There is no abdominal tenderness.  Musculoskeletal:     Cervical back: Neck supple.  Skin:    General: Skin is warm and dry.  Neurological:     General: No focal deficit present.     Mental Status: She is alert.  Psychiatric:     Comments: Calm, cooperative    ED Results / Procedures / Treatments   Labs (all labs ordered are listed, but only abnormal results are displayed) Labs Reviewed  RESP PANEL BY RT-PCR (FLU A&B, COVID) ARPGX2 - Abnormal; Notable for the following components:      Result Value   SARS Coronavirus 2 by RT PCR POSITIVE (*)    All other components within normal limits  CBC WITH DIFFERENTIAL/PLATELET - Abnormal; Notable for the following components:   WBC 3.9 (*)    All other components within normal limits  RAPID URINE DRUG SCREEN, HOSP PERFORMED - Abnormal; Notable for the following components:   Tetrahydrocannabinol POSITIVE (*)    All other components within normal limits  COMPREHENSIVE METABOLIC PANEL  PREGNANCY, URINE    EKG None  Radiology No results found.  Procedures Procedures   Medications Ordered in ED Medications - No data to display  ED Course  I have reviewed the triage vital signs and the nursing notes.  Pertinent labs & imaging results that were available during my care of the patient were reviewed by me and considered in my medical decision making (see chart for details).    MDM Rules/Calculators/A&P                         25 year old presents to ER with concern for increased depression, did endorse some suicidal thoughts but no plan, denies any intent to carry out suicide.  She is calm and cooperative.  Basic labs obtained, incidentally COVID-positive.  No symptoms.  Consult to TTS.  While awaiting TTS assessment, signed out to Dr.  30 who will follow up on their recommendations.  Patient is voluntary.   Final Clinical Impression(s) / ED Diagnoses Final diagnoses:  Depression, unspecified depression type    Rx / DC  Orders ED Discharge Orders     None        Lucrezia Starch, MD 03/23/21 1536

## 2021-03-23 NOTE — ED Notes (Signed)
Ensured client has face mask

## 2021-03-23 NOTE — ED Provider Notes (Signed)
  Physical Exam  BP (!) 127/97 (BP Location: Right Arm)   Pulse (!) 55   Temp 98.1 F (36.7 C) (Oral)   Resp 16   Ht 5\' 4"  (1.626 m)   Wt 56.7 kg   LMP 01/17/2021 (Approximate)   SpO2 100%   BMI 21.46 kg/m   Physical Exam  ED Course/Procedures     Procedures  MDM  Patient here with suicidal ideation with no plan.  Patient is incidentally COVID-positive.  Signout pending TTS consult   10:29 PM TTS saw patient and recommend outpatient follow-up.  Since patient does not have any symptoms for COVID she does not need any treatment right now.  Stable for discharge.  Resources given by behavioral health.   03/19/2021, MD 03/23/21 2229

## 2021-03-23 NOTE — ED Notes (Signed)
Pt talking with TTS at this time.  

## 2021-03-23 NOTE — ED Notes (Signed)
Pt provided hospital scrubs to change, requested urine specimen

## 2021-03-23 NOTE — Discharge Instructions (Addendum)
You are cleared by psychiatry  You do have COVID.  Please isolate for 5 days  Return to ER if you have trouble breathing, thoughts of harming yourself or others   [facility] ([address] [place], [place] [phone]) 2nd Floor Outpatient Psychiatry/Therapy Walk-In Hours:   Therapy Walk-in Hours  Monday-Wednesday: 8 AM until slots are full  Friday: 1 PM to 5 PM  For Monday to Wednesday, it is recommended that patients arrive between 7:30 AM and 7:45 AM because patients will be seen in the order of arrival.  For Friday, we ask that patients arrive between 12 PM to 12:30 PM.  Go to the second floor on arrival and check in.  **Availability is limited; therefore, patients may not be seen on the same day.**  Medication management walk-ins:  Monday to Friday: 8 AM to 11 AM.  It is recommended that patients arrive by 7:30 AM to 7:45 AM because patients will be seen in the order of arrival.  Go to the second floor on arrival and check in.  **Availability is limited; therefore, patients may not be seen on the same day.**

## 2021-05-31 ENCOUNTER — Other Ambulatory Visit: Payer: Self-pay

## 2021-05-31 ENCOUNTER — Ambulatory Visit
Admission: EM | Admit: 2021-05-31 | Discharge: 2021-05-31 | Disposition: A | Discharge status: Home / Self Care | Payer: Medicaid Other | Attending: Emergency Medicine | Admitting: Emergency Medicine

## 2021-05-31 DIAGNOSIS — K529 Noninfective gastroenteritis and colitis, unspecified: Secondary | ICD-10-CM

## 2021-05-31 MED ORDER — ONDANSETRON 8 MG PO TBDP: 8.0000 mg | ORAL_TABLET | Freq: Three times a day (TID) | ORAL | 0 refills | Status: DC | PRN

## 2021-05-31 MED ORDER — LOPERAMIDE HCL 2 MG PO TABS: 4.0000 mg | ORAL_TABLET | Freq: Four times a day (QID) | ORAL | 0 refills | Status: DC | PRN

## 2021-05-31 NOTE — Discharge Instructions (Addendum)
For your diarrhea, you can take 4 mg of loperamide up to 4 times daily as needed.  For nausea, you can take 8 mg of Zofran every 8 hours as needed.  I provided you with a note for work.

## 2021-05-31 NOTE — ED Provider Notes (Signed)
[facility]    CSN: [id number] Arrival date & time: 05/31/21  1014    HISTORY   Chief Complaint  Patient presents with   Abdominal Pain   HPI Kristy Miller is a 26 y.o. female. Pt reports having abd pain (lower abd) with diarrhea for a few days. Patient denies having any urinary changes or lower back pain.  Patient states a coworker has been out sick with a stomach flu, patient states she is here to be tested for this.  Patient denies nausea, vomiting, states has been taking Zofran for prevention of nausea, has not tried a medication for diarrhea.  Patient denies fever, aches, chills.  The history is provided by the patient.  Past Medical History:  Diagnosis Date   Asthma    Eczema    Patient Active Problem List   Diagnosis Date Noted   H/O polyhydramnios 12/10/2014   History of gestational hypertension 12/10/2014   Anemia 08/03/2014   Chlamydia contact, treated 08/03/2014   Past Surgical History:  Procedure Laterality Date   NO PAST SURGERIES     OTHER SURGICAL HISTORY     age 36 , surgery on abd? not sure    OB History     Gravida  1   Para  1   Term  1   Preterm      AB      Living  1      SAB      IAB      Ectopic      Multiple  0   Live Births  1          Home Medications    Prior to Admission medications   Medication Sig Start Date End Date Taking? Authorizing Provider  famotidine (PEPCID) 20 MG tablet Take 1 tablet (20 mg total) by mouth 2 (two) times daily. 02/24/21   Theadora Rama Scales, PA-C  ibuprofen (ADVIL) 600 MG tablet Take 1 tablet (600 mg total) by mouth every 8 (eight) hours as needed for moderate pain. 02/24/21   Theadora Rama Scales, PA-C  Prenatal Vit-Fe Fumarate-FA (MULTIVITAMIN-PRENATAL) 27-0.8 MG TABS tablet Take 1 tablet by mouth daily at 12 noon. 02/24/21 02/19/22  Theadora Rama Scales, PA-C    Family History Family History  Adopted: Yes   Social History Social History   Tobacco Use   Smoking  status: Some Days    Types: Cigarettes   Smokeless tobacco: Never  Vaping Use   Vaping Use: Never used  Substance Use Topics   Alcohol use: Yes    Comment: social   Drug use: Yes    Types: Marijuana    Comment: occasional   Allergies   Patient has no known allergies.  Review of Systems Review of Systems Pertinent findings noted in history of present illness.   Physical Exam Triage Vital Signs ED Triage Vitals  Enc Vitals Group     BP 03/11/21 0827 (!) 147/82     Pulse Rate 03/11/21 0827 72     Resp 03/11/21 0827 18     Temp 03/11/21 0827 98.3 F (36.8 C)     Temp Source 03/11/21 0827 Oral     SpO2 03/11/21 0827 98 %     Weight --      Height --      Head Circumference --      Peak Flow --      Pain Score 03/11/21 0826 5     Pain Loc --  Pain Edu? --      Excl. in GC? --   No data found.  Updated Vital Signs BP (!) 145/89 (BP Location: Right Arm)    Pulse 62    Temp 97.8 F (36.6 C) (Oral)    Resp 16    LMP 05/18/2021    SpO2 96%   Physical Exam Vitals and nursing note reviewed.  Constitutional:      General: She is not in acute distress.    Appearance: Normal appearance. She is not ill-appearing.  HENT:     Head: Normocephalic and atraumatic.  Eyes:     General: Lids are normal.        Right eye: No discharge.        Left eye: No discharge.     Extraocular Movements: Extraocular movements intact.     Conjunctiva/sclera: Conjunctivae normal.     Right eye: Right conjunctiva is not injected.     Left eye: Left conjunctiva is not injected.  Neck:     Trachea: Trachea and phonation normal.  Cardiovascular:     Rate and Rhythm: Normal rate and regular rhythm.     Pulses: Normal pulses.     Heart sounds: Normal heart sounds. No murmur heard.   No friction rub. No gallop.  Pulmonary:     Effort: Pulmonary effort is normal. No accessory muscle usage, prolonged expiration or respiratory distress.     Breath sounds: Normal breath sounds. No stridor,  decreased air movement or transmitted upper airway sounds. No decreased breath sounds, wheezing, rhonchi or rales.  Chest:     Chest wall: No tenderness.  Abdominal:     General: Abdomen is flat. Bowel sounds are normal. There is no distension.     Palpations: Abdomen is soft.     Tenderness: There is generalized abdominal tenderness. There is no right CVA tenderness or left CVA tenderness.     Hernia: No hernia is present.  Musculoskeletal:        General: Normal range of motion.     Cervical back: Normal range of motion and neck supple. Normal range of motion.  Lymphadenopathy:     Cervical: No cervical adenopathy.  Skin:    General: Skin is warm and dry.     Findings: No erythema or rash.  Neurological:     General: No focal deficit present.     Mental Status: She is alert and oriented to person, place, and time.  Psychiatric:        Mood and Affect: Mood normal.        Behavior: Behavior normal.    Visual Acuity Right Eye Distance:   Left Eye Distance:   Bilateral Distance:    Right Eye Near:   Left Eye Near:    Bilateral Near:     UC Couse / Diagnostics / Procedures:    EKG  Radiology No results found.  Procedures Procedures (including critical care time)  UC Diagnoses / Final Clinical Impressions(s)   I have reviewed the triage vital signs and the nursing notes.  Pertinent labs & imaging results that were available during my care of the patient were reviewed by me and considered in my medical decision making (see chart for details).    Final diagnoses:  Gastroenteritis   Patient is otherwise well-appearing on exam today, recommend watchful waiting.  I believe it is likely she has a stomach virus, I have advised patient there is no chance for this.  Patient provided with a  prescription for loperamide and a renewal of Zofran.  Note provided for work.  ED Prescriptions     Medication Sig Dispense Auth. Provider   ondansetron (ZOFRAN-ODT) 8 MG disintegrating  tablet Take 1 tablet (8 mg total) by mouth every 8 (eight) hours as needed for nausea or vomiting. 20 tablet Theadora RamaMorgan, Nyomie Ehrlich Scales, PA-C   loperamide (IMODIUM A-D) 2 MG tablet Take 2 tablets (4 mg total) by mouth 4 (four) times daily as needed for diarrhea or loose stools. 30 tablet Theadora RamaMorgan, Mayan Kloepfer Scales, PA-C      PDMP not reviewed this encounter.  Pending results:  Labs Reviewed - No data to display  Medications Ordered in UC: Medications - No data to display  Disposition Upon Discharge:  Condition: stable for discharge home Home: take medications as prescribed; routine discharge instructions as discussed; follow up as advised.  Patient presented with an acute illness with associated systemic symptoms and significant discomfort requiring urgent management. In my opinion, this is a condition that a prudent lay person (someone who possesses an average knowledge of health and medicine) may potentially expect to result in complications if not addressed urgently such as respiratory distress, impairment of bodily function or dysfunction of bodily organs.   Routine symptom specific, illness specific and/or disease specific instructions were discussed with the patient and/or caregiver at length.   As such, the patient has been evaluated and assessed, work-up was performed and treatment was provided in alignment with urgent care protocols and evidence based medicine.  Patient/parent/caregiver has been advised that the patient may require follow up for further testing and treatment if the symptoms continue in spite of treatment, as clinically indicated and appropriate.  If the patient was tested for COVID-19, Influenza and/or RSV, then the patient/parent/guardian was advised to isolate at home pending the results of his/her diagnostic coronavirus test and potentially longer if theyre positive. I have also advised pt that if his/her COVID-19 test returns positive, it's recommended to self-isolate  for at least 10 days after symptoms first appeared AND until fever-free for 24 hours without fever reducer AND other symptoms have improved or resolved. Discussed self-isolation recommendations as well as instructions for household member/close contacts as per the [facility] and [place] DHHS, and also gave patient the COVID packet with this information.  Patient/parent/caregiver has been [facility] 3-5 days if no better; to PCP or the Emergency Department if new signs and symptoms develop, or if the current signs or symptoms continue to change or worsen for further workup, evaluation and treatment as clinically indicated and appropriate  The patient will follow up with their current PCP if and as advised. If the patient does not currently have a PCP we will assist them in obtaining one.   The patient may need specialty follow up if the symptoms continue, in spite of conservative treatment and management, for further workup, evaluation, consultation and treatment as clinically indicated and appropriate.   Patient/parent/caregiver verbalized understanding and agreement of plan as discussed.  All questions were addressed during visit.  Please see discharge instructions below for further details of plan.  Discharge Instructions:   Discharge Instructions      For your diarrhea, you can take 4 mg of loperamide up to 4 times daily as needed.  For nausea, you can take 8 mg of Zofran every 8 hours as needed.  I provided you with a note for work.      This office note has been dictated  using Teaching laboratory technicianDragon speech recognition software.  Unfortunately, and despite my best efforts, this method of dictation can sometimes lead to occasional typographical or grammatical errors.  I apologize in advance if this occurs.     Theadora RamaMorgan, Patrice Moates Scales, PA-C 05/31/21 1404

## 2021-05-31 NOTE — ED Triage Notes (Signed)
Pt reports having abd pain (lower abd) with diarrhea for a few days. Patient denies having any urinary changes or lower back pain.

## 2021-10-03 ENCOUNTER — Emergency Department (HOSPITAL_BASED_OUTPATIENT_CLINIC_OR_DEPARTMENT_OTHER)
Admission: EM | Admit: 2021-10-03 | Discharge: 2021-10-03 | Disposition: A | Discharge status: Home / Self Care | Payer: Medicaid Other | Attending: Emergency Medicine | Admitting: Emergency Medicine

## 2021-10-03 ENCOUNTER — Encounter (HOSPITAL_BASED_OUTPATIENT_CLINIC_OR_DEPARTMENT_OTHER): Payer: Self-pay | Admitting: Emergency Medicine

## 2021-10-03 DIAGNOSIS — N938 Other specified abnormal uterine and vaginal bleeding: Secondary | ICD-10-CM | POA: Diagnosis present

## 2021-10-03 DIAGNOSIS — J45909 Unspecified asthma, uncomplicated: Secondary | ICD-10-CM | POA: Insufficient documentation

## 2021-10-03 DIAGNOSIS — Z3202 Encounter for pregnancy test, result negative: Secondary | ICD-10-CM | POA: Insufficient documentation

## 2021-10-03 LAB — URINALYSIS, MICROSCOPIC (REFLEX): WBC, UA: NONE SEEN WBC/hpf (ref 0–5)

## 2021-10-03 LAB — URINALYSIS, ROUTINE W REFLEX MICROSCOPIC
Bilirubin Urine: NEGATIVE
Glucose, UA: NEGATIVE mg/dL
Ketones, ur: NEGATIVE mg/dL
Leukocytes,Ua: NEGATIVE
Nitrite: NEGATIVE
Protein, ur: NEGATIVE mg/dL
Specific Gravity, Urine: >=1.03 (ref 1.005–1.030)
pH: 5.5 (ref 5.0–8.0)

## 2021-10-03 LAB — HCG, SERUM, QUALITATIVE: Preg, Serum: NEGATIVE

## 2021-10-03 NOTE — ED Triage Notes (Signed)
Patient reports sharp pain in her abdomen last Tuesday, + pregnancy test at home on Wednesday. Patient states had a dark brown color on Friday and has had bright red bleeding since then with intermittent abdominal cramping.

## 2021-10-03 NOTE — ED Provider Notes (Signed)
[facility] EMERGENCY DEPARTMENT Provider Note   CSN: [id number] Arrival date & time: 10/03/21  1841     History  Chief Complaint  Patient presents with   Vaginal Bleeding    Kristy Miller is a 26 y.o. female.  Pt is a 26 yo female with pmhx significant for asthma.  Pt said she had a normal period on April 20th.  Pt said she took a pregnancy test on 5/16 and it said it was positive.  Pt started having some bleeding on the 19th with some mild cramping.  The bleeding has continued, so she came in to see if she was pregnant or not.      Home Medications Prior to Admission medications   Medication Sig Start Date End Date Taking? Authorizing Provider  famotidine (PEPCID) 20 MG tablet Take 1 tablet (20 mg total) by mouth 2 (two) times daily. 02/24/21   Theadora Rama Scales, PA-C  ibuprofen (ADVIL) 600 MG tablet Take 1 tablet (600 mg total) by mouth every 8 (eight) hours as needed for moderate pain. 02/24/21   Theadora Rama Scales, PA-C  loperamide (IMODIUM A-D) 2 MG tablet Take 2 tablets (4 mg total) by mouth 4 (four) times daily as needed for diarrhea or loose stools. 05/31/21   Theadora Rama Scales, PA-C  ondansetron (ZOFRAN-ODT) 8 MG disintegrating tablet Take 1 tablet (8 mg total) by mouth every 8 (eight) hours as needed for nausea or vomiting. 05/31/21   Theadora Rama Scales, PA-C  Prenatal Vit-Fe Fumarate-FA (MULTIVITAMIN-PRENATAL) 27-0.8 MG TABS tablet Take 1 tablet by mouth daily at 12 noon. 02/24/21 02/19/22  Theadora Rama Scales, PA-C      Allergies    Patient has no known allergies.    Review of Systems   Review of Systems  Gastrointestinal:  Positive for abdominal pain.  Genitourinary:  Positive for vaginal bleeding.  All other systems reviewed and are negative.  Physical Exam Updated Vital Signs BP 132/88 (BP Location: Left Arm)   Pulse 66   Temp 98.6 F (37 C) (Oral)   Resp 16   Ht 5\' 4"  (1.626 m)   Wt 57.6 kg   LMP 09/01/2021 (Approximate)    SpO2 100%   BMI 21.80 kg/m  Physical Exam Vitals and nursing note reviewed.  Constitutional:      Appearance: Normal appearance.  HENT:     Head: Normocephalic and atraumatic.     Right Ear: External ear normal.     Left Ear: External ear normal.     Nose: Nose normal.     Mouth/Throat:     Mouth: Mucous membranes are moist.     Pharynx: Oropharynx is clear.  Eyes:     Extraocular Movements: Extraocular movements intact.     Conjunctiva/sclera: Conjunctivae normal.     Pupils: Pupils are equal, round, and reactive to light.  Cardiovascular:     Rate and Rhythm: Normal rate and regular rhythm.     Pulses: Normal pulses.     Heart sounds: Normal heart sounds.  Pulmonary:     Effort: Pulmonary effort is normal.     Breath sounds: Normal breath sounds.  Abdominal:     General: Abdomen is flat. Bowel sounds are normal.     Palpations: Abdomen is soft.  Genitourinary:    Comments: Pt deferred Musculoskeletal:        General: Normal range of motion.     Cervical back: Normal range of motion and neck supple.  Skin:    General:  Skin is warm.     Capillary Refill: Capillary refill takes less than 2 seconds.  Neurological:     General: No focal deficit present.     Mental Status: She is alert and oriented to person, place, and time.  Psychiatric:        Mood and Affect: Mood normal.        Behavior: Behavior normal.    ED Results / Procedures / Treatments   Labs (all labs ordered are listed, but only abnormal results are displayed) Labs Reviewed  URINALYSIS, ROUTINE W REFLEX MICROSCOPIC - Abnormal; Notable for the following components:      Result Value   Hgb urine dipstick SMALL (*)    All other components within normal limits  URINALYSIS, MICROSCOPIC (REFLEX) - Abnormal; Notable for the following components:   Bacteria, UA RARE (*)    All other components within normal limits  HCG, SERUM, QUALITATIVE    EKG None  Radiology No results  found.  Procedures Procedures    Medications Ordered in ED Medications - No data to display  ED Course/ Medical Decision Making/ A&P                           Medical Decision Making Amount and/or Complexity of Data Reviewed Labs: ordered.   This patient presents to the ED for concern of pregnancy and bleeding, this involves an extensive number of treatment options, and is a complaint that carries with it a high risk of complications and morbidity.  The differential diagnosis includes ectopic, miscarriage, not pregnant and normal period   Co morbidities that complicate the patient evaluation  asthma   Additional history obtained:  Additional history obtained from epic chart review External records from outside source obtained and reviewed including significant other   Lab Tests:  I Ordered, and personally interpreted labs.  The pertinent results include:  serum preg neg; ua neg   Problem List / ED Course:  Vaginal bleeding:  Serum preg neg.  Bleeding is at her usual time for her period.  After pt found out she was not pregnant, she did not want a pelvic exam.  She wants to go home.  Pt is stable for d/c.  She needs to f/u with obgyn.   Reevaluation:  After the interventions noted above, I reevaluated the patient and found that they have :improved   Social Determinants of Health:  Lives at home   Dispostion:  After consideration of the diagnostic results and the patients response to treatment, I feel that the patent would benefit from discharge with outpatient f/u.          Final Clinical Impression(s) / ED Diagnoses Final diagnoses:  Pregnancy examination or test, negative result    Rx / DC Orders ED Discharge Orders     None         Jacalyn Lefevre, MD 10/03/21 2206

## 2021-10-13 ENCOUNTER — Encounter (HOSPITAL_COMMUNITY): Payer: Self-pay | Admitting: Obstetrics & Gynecology

## 2021-10-13 ENCOUNTER — Inpatient Hospital Stay (HOSPITAL_COMMUNITY)
Admission: AD | Admit: 2021-10-13 | Discharge: 2021-10-13 | Disposition: A | Discharge status: Home / Self Care | Payer: Medicaid Other | Attending: Obstetrics & Gynecology | Admitting: Obstetrics & Gynecology

## 2021-10-13 ENCOUNTER — Other Ambulatory Visit: Payer: Self-pay

## 2021-10-13 ENCOUNTER — Encounter: Payer: Medicaid Other | Admitting: Obstetrics & Gynecology

## 2021-10-13 DIAGNOSIS — Z3202 Encounter for pregnancy test, result negative: Secondary | ICD-10-CM | POA: Diagnosis not present

## 2021-10-13 DIAGNOSIS — N946 Dysmenorrhea, unspecified: Secondary | ICD-10-CM | POA: Diagnosis not present

## 2021-10-13 DIAGNOSIS — N62 Hypertrophy of breast: Secondary | ICD-10-CM | POA: Diagnosis not present

## 2021-10-13 DIAGNOSIS — N644 Mastodynia: Secondary | ICD-10-CM | POA: Diagnosis not present

## 2021-10-13 LAB — HCG, QUANTITATIVE, PREGNANCY: hCG, Beta Chain, Quant, S: 1 m[IU]/mL (ref <5)

## 2021-10-13 LAB — POCT PREGNANCY, URINE: Preg Test, Ur: NEGATIVE

## 2021-10-13 NOTE — MAU Note (Signed)
Kristy Miller is a 26 y.o. at Unknown here in MAU reporting: she's having slight abdominal pain that began approx 1.5 weeks ago.  Denies VB.  Had +HPT. LMP: 08/27/2021 Onset of complaint: 1.5 weeks ago Pain score: 0 Vitals:   10/13/21 1355  BP: 128/86  Pulse: 74  Resp: 18  Temp: 98.5 F (36.9 C)  SpO2: 100%     FHT:N/A Lab orders placed from triage:   UPT

## 2021-10-13 NOTE — MAU Provider Note (Signed)
Event Date/Time   First Provider Initiated Contact with Patient 10/13/21 1418      S Ms. Kristy Miller is a 26 y.o. G1P1001 patient who presents to MAU today with complaint of "slight abdominal pain x 1.5 weeks, very sore enlarged breasts for weeks and 5 (+) HPT on 09/29/2021. She reports her LMP was 08/27/2021. She denies VB.  O BP 128/86 (BP Location: Right Arm)   Pulse 74   Temp 98.5 F (36.9 C) (Oral)   Resp 18   Ht 5\' 4"  (1.626 m)   Wt 58.4 kg   LMP 08/27/2021 (Approximate)   SpO2 100%   BMI 22.09 kg/m   Physical Exam Vitals and nursing note reviewed.  Constitutional:      Appearance: Normal appearance. She is normal weight.  Cardiovascular:     Rate and Rhythm: Normal rate.  Pulmonary:     Effort: Pulmonary effort is normal.  Abdominal:     General: Abdomen is flat.  Musculoskeletal:     Cervical back: Normal range of motion.  Neurological:     Mental Status: She is alert and oriented to person, place, and time.  Psychiatric:        Mood and Affect: Mood normal.        Behavior: Behavior normal.        Thought Content: Thought content normal.        Judgment: Judgment normal.   Lab Results  Component Value Date   PREGSERUM NEGATIVE 10/03/2021   Lab Results  Component Value Date   HCGBETAQNT 1 10/13/2021    A Medical screening exam complete Dysmenorrhea   P Discharge from MAU in stable condition Patient given the option of transfer to [facility] for further evaluation or seek care in outpatient facility of choice  Warning signs for worsening condition that would warrant emergency follow-up discussed Patient may return to MAU as needed   [facility], CNM 10/13/2021 2:18 PM

## 2022-06-30 ENCOUNTER — Encounter (HOSPITAL_COMMUNITY): Payer: Self-pay | Admitting: Obstetrics and Gynecology

## 2022-06-30 ENCOUNTER — Inpatient Hospital Stay (HOSPITAL_COMMUNITY)
Admission: EM | Admit: 2022-06-30 | Discharge: 2022-06-30 | Disposition: A | Discharge status: Home / Self Care | Payer: Medicaid Other | Attending: Obstetrics and Gynecology | Admitting: Obstetrics and Gynecology

## 2022-06-30 ENCOUNTER — Other Ambulatory Visit: Payer: Self-pay

## 2022-06-30 ENCOUNTER — Inpatient Hospital Stay (HOSPITAL_COMMUNITY): Payer: Medicaid Other

## 2022-06-30 DIAGNOSIS — Z3A01 Less than 8 weeks gestation of pregnancy: Secondary | ICD-10-CM | POA: Insufficient documentation

## 2022-06-30 DIAGNOSIS — O3680X0 Pregnancy with inconclusive fetal viability, not applicable or unspecified: Secondary | ICD-10-CM | POA: Diagnosis not present

## 2022-06-30 DIAGNOSIS — R110 Nausea: Secondary | ICD-10-CM | POA: Diagnosis not present

## 2022-06-30 DIAGNOSIS — O26891 Other specified pregnancy related conditions, first trimester: Secondary | ICD-10-CM | POA: Insufficient documentation

## 2022-06-30 DIAGNOSIS — R1032 Left lower quadrant pain: Secondary | ICD-10-CM | POA: Insufficient documentation

## 2022-06-30 HISTORY — DX: Anxiety disorder, unspecified: F41.9

## 2022-06-30 HISTORY — DX: Depression, unspecified: F32.A

## 2022-06-30 HISTORY — DX: Urinary tract infection, site not specified: N39.0

## 2022-06-30 LAB — URINALYSIS, ROUTINE W REFLEX MICROSCOPIC
Bilirubin Urine: NEGATIVE
Glucose, UA: NEGATIVE mg/dL
Hgb urine dipstick: NEGATIVE
Ketones, ur: 5 mg/dL — AB
Leukocytes,Ua: NEGATIVE
Nitrite: NEGATIVE
Protein, ur: NEGATIVE mg/dL
Specific Gravity, Urine: 1.029 (ref 1.005–1.030)
pH: 5 (ref 5.0–8.0)

## 2022-06-30 LAB — CBC
HCT: 35.9 % — ABNORMAL LOW (ref 36.0–46.0)
Hemoglobin: 12.2 g/dL (ref 12.0–15.0)
MCH: 30.2 pg (ref 26.0–34.0)
MCHC: 34 g/dL (ref 30.0–36.0)
MCV: 88.9 fL (ref 80.0–100.0)
Platelets: 379 10*3/uL (ref 150–400)
RBC: 4.04 MIL/uL (ref 3.87–5.11)
RDW: 11.9 % (ref 11.5–15.5)
WBC: 4.8 10*3/uL (ref 4.0–10.5)
nRBC: 0 % (ref 0.0–0.2)

## 2022-06-30 LAB — POCT PREGNANCY, URINE: Preg Test, Ur: POSITIVE — AB

## 2022-06-30 LAB — HCG, QUANTITATIVE, PREGNANCY: hCG, Beta Chain, Quant, S: 579 m[IU]/mL — ABNORMAL HIGH (ref <5)

## 2022-06-30 NOTE — Discharge Instructions (Signed)
Return to care  If you have heavier bleeding that soaks through more than 2 pads per hour for an hour or more If you bleed so much that you feel like you might pass out or you do pass out If you have significant abdominal pain that is not improved with Tylenol   

## 2022-06-30 NOTE — MAU Provider Note (Signed)
History     [id number]  Arrival date and time: 06/30/22 [id number]    Chief Complaint  Patient presents with   Abdominal Pain   Possible Pregnancy     HPI Kristy Miller is a 27 y.o. at 16w4dwho presents for abdominal pain. Pain started last night. Reports cramping & sharp pains in left lower quadrant. Pain has improved since last night without intervention. Has had some nausea & gagging but no vomiting. Denies fever, diarrhea, constipation, dysuria, vaginal bleeding, or vaginal discharge.   OB History     Gravida  3   Para  1   Term  1   Preterm      AB  1   Living  1      SAB  1   IAB      Ectopic      Multiple  0   Live Births  1           Past Medical History:  Diagnosis Date   Anxiety    Asthma    Depression    doing good   Eczema    UTI (urinary tract infection)     Past Surgical History:  Procedure Laterality Date   NO PAST SURGERIES     OTHER SURGICAL HISTORY     age 27, surgery on abd? not sure     Family History  Adopted: Yes    No Known Allergies  No current facility-administered medications on file prior to encounter.   No current outpatient medications on file prior to encounter.     ROS Pertinent positives and negative per HPI, all others reviewed and negative  Physical Exam   BP 129/69 (BP Location: Right Arm)   Pulse 74   Temp 98.1 F (36.7 C) (Oral)   Resp 16   Ht 5' 4"$  (1.626 m)   Wt 58.9 kg   LMP 05/22/2022 (Approximate)   SpO2 100%   BMI 22.28 kg/m   Patient Vitals for the past 24 hrs:  BP Temp Temp src Pulse Resp SpO2 Height Weight  06/30/22 1110 129/69 98.1 F (36.7 C) Oral 74 16 100 % 5' 4"$  (1.626 m) 58.9 kg    Physical Exam Vitals and nursing note reviewed.  Constitutional:      General: She is not in acute distress.    Appearance: She is well-developed.  HENT:     Head: Normocephalic and atraumatic.  Pulmonary:     Effort: Pulmonary effort is normal. No respiratory distress.  Abdominal:      General: Abdomen is flat.     Palpations: Abdomen is soft.     Tenderness: There is no abdominal tenderness. There is no guarding or rebound.  Skin:    General: Skin is warm and dry.  Neurological:     Mental Status: She is alert.  Psychiatric:        Mood and Affect: Mood normal.        Behavior: Behavior normal.      Labs Results for orders placed or performed during the hospital encounter of 06/30/22 (from the past 24 hour(s))  Pregnancy, urine POC     Status: Abnormal   Collection Time: 06/30/22 10:17 AM  Result Value Ref Range   Preg Test, Ur POSITIVE (A) NEGATIVE  CBC     Status: Abnormal   Collection Time: 06/30/22 11:51 AM  Result Value Ref Range   WBC 4.8 4.0 - 10.5 K/uL   RBC 4.04 3.87 - 5.11  MIL/uL   Hemoglobin 12.2 12.0 - 15.0 g/dL   HCT 35.9 (L) 36.0 - 46.0 %   MCV 88.9 80.0 - 100.0 fL   MCH 30.2 26.0 - 34.0 pg   MCHC 34.0 30.0 - 36.0 g/dL   RDW 11.9 11.5 - 15.5 %   Platelets 379 150 - 400 K/uL   nRBC 0.0 0.0 - 0.2 %  hCG, quantitative, pregnancy     Status: Abnormal   Collection Time: 06/30/22 11:51 AM  Result Value Ref Range   hCG, Beta Chain, Quant, S 579 (H) <5 mIU/mL  Urinalysis, Routine w reflex microscopic -Urine, Clean Catch     Status: Abnormal   Collection Time: 06/30/22 12:20 PM  Result Value Ref Range   Color, Urine YELLOW YELLOW   APPearance CLEAR CLEAR   Specific Gravity, Urine 1.029 1.005 - 1.030   pH 5.0 5.0 - 8.0   Glucose, UA NEGATIVE NEGATIVE mg/dL   Hgb urine dipstick NEGATIVE NEGATIVE   Bilirubin Urine NEGATIVE NEGATIVE   Ketones, ur 5 (A) NEGATIVE mg/dL   Protein, ur NEGATIVE NEGATIVE mg/dL   Nitrite NEGATIVE NEGATIVE   Leukocytes,Ua NEGATIVE NEGATIVE    Imaging US OB LESS THAN 14 WEEKS WITH OB TRANSVAGINAL  Result Date: 06/30/2022 CLINICAL DATA:  Abdominal pain EXAM: OBSTETRIC <14 WK [place] AND TRANSVAGINAL OB US TECHNIQUE: Both transabdominal and transvaginal ultrasound examinations were performed for complete evaluation of the  gestation as well as the maternal uterus, adnexal regions, and pelvic cul-de-sac. Transvaginal technique was performed to assess early pregnancy. COMPARISON:  None Available. FINDINGS: Intrauterine gestational sac: None Yolk sac:  Not Visualized. Embryo:  Not Visualized. Cardiac Activity: Not Visualized. Heart Rate:   bpm MSD:   mm    w     d CRL:    mm    w    d                  [place] EDC: Subchorionic hemorrhage:  None visualized. Maternal uterus/adnexae: No adnexal mass. Trace free fluid in the cul-de-sac. IMPRESSION: No intrauterine pregnancy visualized. Differential considerations would include early intrauterine pregnancy too early to visualize, spontaneous abortion, or occult ectopic pregnancy. Recommend close clinical followup and serial quantitative beta HCGs and ultrasounds. Electronically Signed   By: Rolm Baptise M.D.   On: 06/30/2022 13:21    MAU Course  Procedures Lab Orders         CBC         hCG, quantitative, pregnancy         Urinalysis, Routine w reflex microscopic -Urine, Clean Catch         Pregnancy, urine POC    No orders of the defined types were placed in this encounter.  Imaging Orders         US OB LESS THAN 14 WEEKS WITH OB TRANSVAGINAL      MDM HCG today is 570. Ultrasound shows no intrauterine or extrauterine pregnancy.   Discussed with patient the diagnosis of pregnancy of unknown anatomic location.  Three possibilities of outcome are: a healthy pregnancy that is too early to see a yolk sac to confirm the pregnancy is in the uterus, a pregnancy that is not healthy and has not developed and will not develop, and an ectopic pregnancy that cannot be identified at this time. She is aware that she needs to follow-up at MAU on Sunday for repeat beta-hCG level to determine next steps. All questions were answered, MAU precautions discussed.  Assessment and  Plan   1. Pregnancy of unknown anatomic location   2. [redacted] weeks gestation of pregnancy    -Return Sunday for stat  hcg -reviewed ectopic precautions & reasons to return to [place], NP 06/30/22 5:10 PM

## 2022-06-30 NOTE — MAU Note (Signed)
Kristy Miller is a 27 y.o. at Unknown here in MAU reporting: +HPT on Sunday.  Last night started having severe abd pain and cramping, was taking her breath away.  Not feeling it so much this morning. No bleeding.  LMP: ~1/8 Onset of complaint: last night Pain score: 1 Vitals:   06/30/22 1110  BP: 129/69  Pulse: 74  Resp: 16  Temp: 98.1 F (36.7 C)  SpO2: 100%     Lab orders placed from triage:   Preg confirmed here

## 2022-07-02 ENCOUNTER — Telehealth (HOSPITAL_BASED_OUTPATIENT_CLINIC_OR_DEPARTMENT_OTHER): Payer: Self-pay | Admitting: Advanced Practice Midwife

## 2022-07-02 ENCOUNTER — Encounter (HOSPITAL_BASED_OUTPATIENT_CLINIC_OR_DEPARTMENT_OTHER): Payer: Self-pay | Admitting: Advanced Practice Midwife

## 2022-07-02 ENCOUNTER — Inpatient Hospital Stay (HOSPITAL_COMMUNITY)
Admit: 2022-07-02 | Discharge: 2022-07-02 | Disposition: A | Discharge status: Home / Self Care | Payer: Medicaid Other | Attending: Obstetrics and Gynecology | Admitting: Obstetrics and Gynecology

## 2022-07-02 DIAGNOSIS — Z3201 Encounter for pregnancy test, result positive: Secondary | ICD-10-CM | POA: Insufficient documentation

## 2022-07-02 DIAGNOSIS — O3680X0 Pregnancy with inconclusive fetal viability, not applicable or unspecified: Secondary | ICD-10-CM

## 2022-07-02 LAB — HCG, QUANTITATIVE, PREGNANCY: hCG, Beta Chain, Quant, S: 1247 m[IU]/mL — ABNORMAL HIGH (ref <5)

## 2022-07-02 NOTE — Telephone Encounter (Signed)
Pt does not have a working phone number. She was directed to lab from registration today and did not see MAU nurse or provider.  Hcg resulted and provider attempted to call but unable to leave message.  Hcg rose appropriately in 48 hours.  MyChart message sent and outpatient [place] ordered in 1 week.

## 2022-07-03 ENCOUNTER — Telehealth (HOSPITAL_BASED_OUTPATIENT_CLINIC_OR_DEPARTMENT_OTHER): Payer: Self-pay | Admitting: Advanced Practice Midwife

## 2022-07-03 ENCOUNTER — Encounter (HOSPITAL_BASED_OUTPATIENT_CLINIC_OR_DEPARTMENT_OTHER): Payer: Self-pay | Admitting: Advanced Practice Midwife

## 2022-07-03 NOTE — Telephone Encounter (Signed)
Attempted to return pt phone call to [place] office but pt number listed is not a working number.  Will send Mychart message and request updated phone contact info.

## 2022-07-10 ENCOUNTER — Other Ambulatory Visit: Payer: Self-pay

## 2022-07-10 ENCOUNTER — Observation Stay (HOSPITAL_COMMUNITY)
Admission: AD | Admit: 2022-07-10 | Discharge: 2022-07-11 | Disposition: A | Discharge status: Home / Self Care | Payer: Medicaid Other | Attending: Obstetrics and Gynecology | Admitting: Obstetrics and Gynecology

## 2022-07-10 ENCOUNTER — Ambulatory Visit
Admission: RE | Admit: 2022-07-10 | Discharge: 2022-07-10 | Disposition: A | Discharge status: Home / Self Care | Payer: Medicaid Other | Source: Ambulatory Visit | Attending: Advanced Practice Midwife | Admitting: Advanced Practice Midwife

## 2022-07-10 ENCOUNTER — Encounter (HOSPITAL_COMMUNITY): Payer: Self-pay | Admitting: Family Medicine

## 2022-07-10 ENCOUNTER — Ambulatory Visit (INDEPENDENT_AMBULATORY_CARE_PROVIDER_SITE_OTHER): Payer: Medicaid Other | Admitting: Family Medicine

## 2022-07-10 ENCOUNTER — Encounter: Payer: Self-pay | Admitting: Family Medicine

## 2022-07-10 DIAGNOSIS — Z3A01 Less than 8 weeks gestation of pregnancy: Secondary | ICD-10-CM | POA: Diagnosis not present

## 2022-07-10 DIAGNOSIS — J45909 Unspecified asthma, uncomplicated: Secondary | ICD-10-CM | POA: Insufficient documentation

## 2022-07-10 DIAGNOSIS — O3680X0 Pregnancy with inconclusive fetal viability, not applicable or unspecified: Secondary | ICD-10-CM | POA: Insufficient documentation

## 2022-07-10 DIAGNOSIS — O00201 Right ovarian pregnancy without intrauterine pregnancy: Secondary | ICD-10-CM | POA: Diagnosis not present

## 2022-07-10 DIAGNOSIS — O009 Unspecified ectopic pregnancy without intrauterine pregnancy: Secondary | ICD-10-CM | POA: Diagnosis present

## 2022-07-10 LAB — TYPE AND SCREEN
ABO/RH(D): O POS
Antibody Screen: NEGATIVE

## 2022-07-10 LAB — COMPREHENSIVE METABOLIC PANEL
ALT: 13 U/L (ref 0–44)
AST: 23 U/L (ref 15–41)
Albumin: 4.1 g/dL (ref 3.5–5.0)
Alkaline Phosphatase: 50 U/L (ref 38–126)
Anion gap: 9 (ref 5–15)
BUN: 8 mg/dL (ref 6–20)
CO2: 25 mmol/L (ref 22–32)
Calcium: 9.3 mg/dL (ref 8.9–10.3)
Chloride: 103 mmol/L (ref 98–111)
Creatinine, Ser: 0.73 mg/dL (ref 0.44–1.00)
GFR, Estimated: >60 mL/min (ref >60)
Glucose, Bld: 86 mg/dL (ref 70–99)
Potassium: 3.4 mmol/L — ABNORMAL LOW (ref 3.5–5.1)
Sodium: 137 mmol/L (ref 135–145)
Total Bilirubin: 0.4 mg/dL (ref 0.3–1.2)
Total Protein: 7.1 g/dL (ref 6.5–8.1)

## 2022-07-10 LAB — CBC
HCT: 36.5 % (ref 36.0–46.0)
Hemoglobin: 12.9 g/dL (ref 12.0–15.0)
MCH: 30.9 pg (ref 26.0–34.0)
MCHC: 35.3 g/dL (ref 30.0–36.0)
MCV: 87.3 fL (ref 80.0–100.0)
Platelets: 344 10*3/uL (ref 150–400)
RBC: 4.18 MIL/uL (ref 3.87–5.11)
RDW: 11.7 % (ref 11.5–15.5)
WBC: 5 10*3/uL (ref 4.0–10.5)
nRBC: 0 % (ref 0.0–0.2)

## 2022-07-10 LAB — HCG, QUANTITATIVE, PREGNANCY: hCG, Beta Chain, Quant, S: 14449 m[IU]/mL — ABNORMAL HIGH (ref <5)

## 2022-07-10 MED ORDER — LACTATED RINGERS IV SOLN: 125.0000 mL/h | INTRAVENOUS | Status: AC

## 2022-07-10 MED ORDER — DOCUSATE SODIUM 100 MG PO CAPS: 100.0000 mg | ORAL_CAPSULE | Freq: Every day | ORAL | Status: DC

## 2022-07-10 MED ORDER — LACTATED RINGERS IV SOLN: INTRAVENOUS | Status: DC

## 2022-07-10 MED ORDER — LACTATED RINGERS IV BOLUS
500.0000 mL | Freq: Once | INTRAVENOUS | Status: AC
  Administered 2022-07-10: 500 mL via INTRAVENOUS

## 2022-07-10 MED ORDER — CALCIUM CARBONATE ANTACID 500 MG PO CHEW: 2.0000 | CHEWABLE_TABLET | ORAL | Status: DC | PRN

## 2022-07-10 MED ORDER — PRENATAL MULTIVITAMIN CH: 1.0000 | ORAL_TABLET | Freq: Every day | ORAL | Status: DC

## 2022-07-10 MED ORDER — ACETAMINOPHEN 325 MG PO TABS: 650.0000 mg | ORAL_TABLET | ORAL | Status: DC | PRN

## 2022-07-10 NOTE — MAU Provider Note (Signed)
History     CSN: [id number]  Arrival date and time: 07/10/22 1256       Chief Complaint  Patient presents with   Ectopic Pregnancy   HPI This is a 27 yo G3P1011 at 66w0dby LMP. She was seen in the office earlier today and diagnosed with an ectopic twin gestation in the right salpinx. She has been having intermittent severe pain on the RLQ.   OB History     Gravida  3   Para  1   Term  1   Preterm      AB  1   Living  1      SAB  1   IAB      Ectopic      Multiple  0   Live Births  1           Past Medical History:  Diagnosis Date   Anxiety    Asthma    Depression    doing good   Eczema    UTI (urinary tract infection)     Past Surgical History:  Procedure Laterality Date   NO PAST SURGERIES     OTHER SURGICAL HISTORY     age 27, surgery on abd? not sure     Family History  Adopted: Yes    Social History   Tobacco Use   Smoking status: Never   Smokeless tobacco: Never  Vaping Use   Vaping Use: Former  Substance Use Topics   Alcohol use: Not Currently    Comment: social   Drug use: Not Currently    Types: Marijuana    Comment: none- quit early 2023    Allergies: No Known Allergies  No medications prior to admission.    Review of Systems Physical Exam   Blood pressure 132/70, pulse 86, temperature 98.1 F (36.7 C), resp. rate 18, height '5\' 4"'$  (1.626 m), weight 58.5 kg, last menstrual period 05/22/2022, currently breastfeeding.  Physical Exam Vitals reviewed.  Constitutional:      Appearance: Normal appearance.  Cardiovascular:     Rate and Rhythm: Normal rate.  Pulmonary:     Effort: Pulmonary effort is normal.  Abdominal:     General: Abdomen is flat.     Palpations: Abdomen is soft.  Skin:    Capillary Refill: Capillary refill takes less than 2 seconds.  Neurological:     General: No focal deficit present.     Mental Status: She is alert.  Psychiatric:        Mood and Affect: Mood normal.        Behavior:  Behavior normal.        Thought Content: Thought content normal.        Judgment: Judgment normal.    Results for orders placed or performed during the hospital encounter of 07/10/22 (from the past 24 hour(s))  CBC     Status: None   Collection Time: 07/10/22  1:08 PM  Result Value Ref Range   WBC 5.0 4.0 - 10.5 K/uL   RBC 4.18 3.87 - 5.11 MIL/uL   Hemoglobin 12.9 12.0 - 15.0 g/dL   HCT 36.5 36.0 - 46.0 %   MCV 87.3 80.0 - 100.0 fL   MCH 30.9 26.0 - 34.0 pg   MCHC 35.3 30.0 - 36.0 g/dL   RDW 11.7 11.5 - 15.5 %   Platelets 344 150 - 400 K/uL   nRBC 0.0 0.0 - 0.2 %  Comprehensive metabolic panel  Status: Abnormal   Collection Time: 07/10/22  1:08 PM  Result Value Ref Range   Sodium 137 135 - 145 mmol/L   Potassium 3.4 (L) 3.5 - 5.1 mmol/L   Chloride 103 98 - 111 mmol/L   CO2 25 22 - 32 mmol/L   Glucose, Bld 86 70 - 99 mg/dL   BUN 8 6 - 20 mg/dL   Creatinine, Ser 0.73 0.44 - 1.00 mg/dL   Calcium 9.3 8.9 - 10.3 mg/dL   Total Protein 7.1 6.5 - 8.1 g/dL   Albumin 4.1 3.5 - 5.0 g/dL   AST 23 15 - 41 U/L   ALT 13 0 - 44 U/L   Alkaline Phosphatase 50 38 - 126 U/L   Total Bilirubin 0.4 0.3 - 1.2 mg/dL   GFR, Estimated >60 >60 mL/min   Anion gap 9 5 - 15  hCG, quantitative, pregnancy     Status: Abnormal   Collection Time: 07/10/22  1:08 PM  Result Value Ref Range   hCG, Beta Chain, Quant, S 14,449 (H) <5 mIU/mL   US OB Transvaginal  Result Date: 07/10/2022 CLINICAL DATA:  Evaluation of viability EXAM: OBSTETRIC <14 WK [place] AND TRANSVAGINAL OB US TECHNIQUE: Both transabdominal and transvaginal ultrasound examinations were performed for complete evaluation of the gestation as well as the maternal uterus, adnexal regions, and pelvic cul-de-sac. Transvaginal technique was performed to assess early pregnancy. COMPARISON:  Obstetric ultrasound dated 06/30/2022 FINDINGS: Intrauterine gestational sac: None Yolk sac:  Not Visualized. Embryo:  Not Visualized. Cardiac Activity: Not  Visualized. Heart Rate: Not applicable MSD: N/A CRL: N/A Subchorionic hemorrhage:  None visualized. Maternal uterus/adnexae: Right adnexal structure, medial to and separate from the right ovary, which contains a corpus luteum, measures 2.4 x 2.0 x 2.0 cm and contains 2 thick walled cystic structures, each containing an internal yolk sac and presumed fetal pole. Normal left ovary. Trace pelvic free fluid. IMPRESSION: Right adnexal ectopic twin pregnancy. Critical Value/emergent results were called by telephone at the time of interpretation on 07/10/2022 at 10:35 am to provider Lauretta Chester, who verbally acknowledged these results. Electronically Signed   By: Darrin Nipper M.D.   On: 07/10/2022 10:51     MAU Course  Procedures  MDM Discussed situation with patient - with HCG much greater than 5000, she is at a much higher risk of failure and need for surgery. In addition, she would prefer surgery instead of MTX. She last ate at 11:30a.   I discussed patient with Dr Elgie Congo, who will evaluate patient.  Assessment and Plan    Kristy Miller 07/10/2022, 3:05 PM

## 2022-07-10 NOTE — H&P (Signed)
[facility] Attending History and Physical  Kristy Miller is a 27 y.o. G3P1011 at 7w0dwho presented to MAU today for evaluation of incidental finding of twin pregnancy in the right adnexa.  She has had intermittent right sided pain the last few weeks.  Positive nausea noted.  Twin ectopic pregnancy seen on ultrasound today.  Pt denies hx of gonorrhea and chlamydia. Currently, the patient denies pain.  She last ate a full meal at 1130 AM today.  Past Medical History:  Diagnosis Date   Anxiety    Asthma    nothing after age 27  Depression    doing good   Eczema    UTI (urinary tract infection)    Past Surgical History:  Procedure Laterality Date   HERNIA REPAIR     umbilical, age 27  OTHER SURGICAL HISTORY     age 27, surgery on abd? not sure    OB History  Gravida Para Term Preterm AB Living  '3 1 1   1 1  '$ SAB IAB Ectopic Multiple Live Births  1     0 1    # Outcome Date GA Lbr Len/2nd Weight Sex Delivery Anes PTL Lv  3 Current           2 SAB 07/2021          1 Term 11/01/14 448w5d7:01 / 06:59 3249 g F Vag-Vacuum EPI  LIV    Obstetric Comments  BP started going up at end of pregnancy.   Patient denies any other pertinent gynecologic issues.  No current facility-administered medications on file prior to encounter.   No current outpatient medications on file prior to encounter.   No Known Allergies  Social History:   reports that she has never smoked. She has never used smokeless tobacco. She reports that she does not currently use alcohol. She reports that she does not currently use drugs after having used the following drugs: Marijuana. Family History  Adopted: Yes    Review of Systems: Pertinent items noted in HPI and remainder of comprehensive ROS otherwise negative.  PHYSICAL EXAM: Blood pressure 134/82, pulse 68, temperature 98.4 F (36.9 C), resp. rate 16, height '5\' 4"'$  (1.626 m), weight 58.5 kg, last menstrual period 05/22/2022,  SpO2 100 %, currently breastfeeding. CONSTITUTIONAL: Well-developed, well-nourished female in no acute distress.  HENT:  Normocephalic, atraumatic, External right and left ear normal. Oropharynx is clear and moist EYES: Conjunctivae and EOM are normal.  NECK: Normal range of motion, supple, no masses SKIN: Skin is warm and dry. No rash noted. Not diaphoretic. No erythema. No pallor. NEUROLOGIC: Alert and oriented to person, place, and time. Normal reflexes, muscle tone coordination. No cranial nerve deficit noted. PSYCHIATRIC: Normal mood and affect. Normal behavior. Normal judgment and thought content. CARDIOVASCULAR: Normal heart rate noted, regular rhythm RESPIRATORY: Effort and breath sounds normal, no problems with respiration noted ABDOMEN: Soft, nontender, nondistended. Small scar just below umbilicus PELVIC: deferred MUSCULOSKELETAL: Normal range of motion. No tenderness.  No cyanosis, clubbing, or edema.  2+ distal pulses.  Labs: Results for orders placed or performed during the hospital encounter of 07/10/22 (from the past 336 hour(s))  CBC   Collection Time: 07/10/22  1:08 PM  Result Value Ref Range   WBC 5.0 4.0 - 10.5 K/uL   RBC 4.18 3.87 - 5.11 MIL/uL   Hemoglobin 12.9 12.0 - 15.0 g/dL   HCT 36.5 36.0 - 46.0 %   MCV 87.3 80.0 -  100.0 fL   MCH 30.9 26.0 - 34.0 pg   MCHC 35.3 30.0 - 36.0 g/dL   RDW 11.7 11.5 - 15.5 %   Platelets 344 150 - 400 K/uL   nRBC 0.0 0.0 - 0.2 %  Comprehensive metabolic panel   Collection Time: 07/10/22  1:08 PM  Result Value Ref Range   Sodium 137 135 - 145 mmol/L   Potassium 3.4 (L) 3.5 - 5.1 mmol/L   Chloride 103 98 - 111 mmol/L   CO2 25 22 - 32 mmol/L   Glucose, Bld 86 70 - 99 mg/dL   BUN 8 6 - 20 mg/dL   Creatinine, Ser 0.73 0.44 - 1.00 mg/dL   Calcium 9.3 8.9 - 10.3 mg/dL   Total Protein 7.1 6.5 - 8.1 g/dL   Albumin 4.1 3.5 - 5.0 g/dL   AST 23 15 - 41 U/L   ALT 13 0 - 44 U/L   Alkaline Phosphatase 50 38 - 126 U/L   Total  Bilirubin 0.4 0.3 - 1.2 mg/dL   GFR, Estimated >60 >60 mL/min   Anion gap 9 5 - 15  hCG, quantitative, pregnancy   Collection Time: 07/10/22  1:08 PM  Result Value Ref Range   hCG, Beta Chain, Quant, S 14,449 (H) <5 mIU/mL  Results for orders placed or performed during the hospital encounter of 07/02/22 (from the past 336 hour(s))  hCG, quantitative, pregnancy   Collection Time: 07/02/22 10:12 AM  Result Value Ref Range   hCG, Beta Chain, Quant, S 1,247 (H) <5 mIU/mL  Results for orders placed or performed during the hospital encounter of 06/30/22 (from the past 336 hour(s))  Pregnancy, urine POC   Collection Time: 06/30/22 10:17 AM  Result Value Ref Range   Preg Test, Ur POSITIVE (A) NEGATIVE  CBC   Collection Time: 06/30/22 11:51 AM  Result Value Ref Range   WBC 4.8 4.0 - 10.5 K/uL   RBC 4.04 3.87 - 5.11 MIL/uL   Hemoglobin 12.2 12.0 - 15.0 g/dL   HCT 35.9 (L) 36.0 - 46.0 %   MCV 88.9 80.0 - 100.0 fL   MCH 30.2 26.0 - 34.0 pg   MCHC 34.0 30.0 - 36.0 g/dL   RDW 11.9 11.5 - 15.5 %   Platelets 379 150 - 400 K/uL   nRBC 0.0 0.0 - 0.2 %  hCG, quantitative, pregnancy   Collection Time: 06/30/22 11:51 AM  Result Value Ref Range   hCG, Beta Chain, Quant, S 579 (H) <5 mIU/mL  Urinalysis, Routine w reflex microscopic -Urine, Clean Catch   Collection Time: 06/30/22 12:20 PM  Result Value Ref Range   Color, Urine YELLOW YELLOW   APPearance CLEAR CLEAR   Specific Gravity, Urine 1.029 1.005 - 1.030   pH 5.0 5.0 - 8.0   Glucose, UA NEGATIVE NEGATIVE mg/dL   Hgb urine dipstick NEGATIVE NEGATIVE   Bilirubin Urine NEGATIVE NEGATIVE   Ketones, ur 5 (A) NEGATIVE mg/dL   Protein, ur NEGATIVE NEGATIVE mg/dL   Nitrite NEGATIVE NEGATIVE   Leukocytes,Ua NEGATIVE NEGATIVE    Imaging Studies: US OB Transvaginal  Result Date: 07/10/2022 CLINICAL DATA:  Evaluation of viability EXAM: OBSTETRIC <14 WK [place] AND TRANSVAGINAL OB US TECHNIQUE: Both transabdominal and transvaginal ultrasound  examinations were performed for complete evaluation of the gestation as well as the maternal uterus, adnexal regions, and pelvic cul-de-sac. Transvaginal technique was performed to assess early pregnancy. COMPARISON:  Obstetric ultrasound dated 06/30/2022 FINDINGS: Intrauterine gestational sac: None Yolk sac:  Not  Visualized. Embryo:  Not Visualized. Cardiac Activity: Not Visualized. Heart Rate: Not applicable MSD: N/A CRL: N/A Subchorionic hemorrhage:  None visualized. Maternal uterus/adnexae: Right adnexal structure, medial to and separate from the right ovary, which contains a corpus luteum, measures 2.4 x 2.0 x 2.0 cm and contains 2 thick walled cystic structures, each containing an internal yolk sac and presumed fetal pole. Normal left ovary. Trace pelvic free fluid. IMPRESSION: Right adnexal ectopic twin pregnancy. Critical Value/emergent results were called by telephone at the time of interpretation on 07/10/2022 at 10:35 am to provider Lauretta Chester, who verbally acknowledged these results. Electronically Signed   By: Darrin Nipper M.D.   On: 07/10/2022 10:51   US OB LESS THAN 14 WEEKS WITH OB TRANSVAGINAL  Result Date: 06/30/2022 CLINICAL DATA:  Abdominal pain EXAM: OBSTETRIC <14 WK [place] AND TRANSVAGINAL OB US TECHNIQUE: Both transabdominal and transvaginal ultrasound examinations were performed for complete evaluation of the gestation as well as the maternal uterus, adnexal regions, and pelvic cul-de-sac. Transvaginal technique was performed to assess early pregnancy. COMPARISON:  None Available. FINDINGS: Intrauterine gestational sac: None Yolk sac:  Not Visualized. Embryo:  Not Visualized. Cardiac Activity: Not Visualized. Heart Rate:   bpm MSD:   mm    w     d CRL:    mm    w    d                  [place] EDC: Subchorionic hemorrhage:  None visualized. Maternal uterus/adnexae: No adnexal mass. Trace free fluid in the cul-de-sac. IMPRESSION: No intrauterine pregnancy visualized. Differential considerations  would include early intrauterine pregnancy too early to visualize, spontaneous abortion, or occult ectopic pregnancy. Recommend close clinical followup and serial quantitative beta HCGs and ultrasounds. Electronically Signed   By: Rolm Baptise M.D.   On: 06/30/2022 13:21    Assessment: Principal Problem:   Ectopic pregnancy   Plan: Admit to Women's Unit  Pt ate a full meal at 1130. I have spoken to anesthesia and they have recommended proceeding after 8 hours .  If the patient's condition worsens, we will expedite treatment .  Otherwise she is scheduled for laparoscopic removal of the ectopic(s). 27 y.o. [id number] with twin ectopic pregnancy in the right adnexa.   On exam, she had stable vital signs, and non surgical abdomen  . Patient was counseled regarding need for treatment.  She was offered methotrexate, but in light of the rarity of the case, she desires surgical removal.  Risks of surgery including bleeding which may require transfusion or reoperation, infection, injury to bowel or other surrounding organs, need for additional procedures including (laparoscopy or) laparotomy were explained to patient and written informed consent was obtained.  Patient has been NPO since initial evaluation and she will remain NPO until procedure is completed. Anesthesia and OR aware.  Routine floor care    Lynnda Shields, MD, [place], [facility] for [facility], [place]

## 2022-07-10 NOTE — Progress Notes (Signed)
  Amenorrhea with positive UPT PROBLEM  VISIT ENCOUNTER NOTE  Subjective:   Kristy Miller is a 27 y.o. [phone] female here for  dating [place] and radiology called with critical imaging report.   Talked to Dr. Darrin Nipper at [phone] AM  Noted to have twin gestation in the right adnexa  She reports that she has been having intermittent right sided pain that was severe this weekend 2/24 and 2/25. It was improved this morning. She was seen in the MAU for similar on 2/16.  Denies abnormal vaginal bleeding, discharge, pelvic pain, problems with intercourse or other gynecologic concerns.    Gynecologic History Patient's last menstrual period was 05/22/2022 (approximate).  Health Maintenance Due  Topic Date Due   COVID-19 Vaccine (1) Never done   Hepatitis C Screening  Never done   PAP-Cervical Cytology Screening  Never done   PAP SMEAR-Modifier  Never done   INFLUENZA VACCINE  Never done    The following portions of the patient's history were reviewed and updated as appropriate: allergies, current medications, past family history, past medical history, past social history, past surgical history and problem list.  Review of Systems Pertinent items are noted in HPI.   Objective:  LMP 05/22/2022 (Approximate)  Gen: well appearing, NAD HEENT: no scleral icterus CV: RR Lung: Normal WOB Ext: warm well perfused   Assessment and Plan:  1. Ectopic pregnancy of right ovary Called report to MAU at 11:12- spoke with Dr Nehemiah Settle Discussed with patient ectopic pregnancy with twin gestation and need for MXT treatment Reviewed she will get labs at the MAU and assessment with provider. Reviewed that she will need follow up [facility]  Provided support to patient    Please refer to After Visit Summary for other counseling recommendations.   Return if symptoms worsen or fail to improve.  Caren Macadam, MD, MPH, ABFM Attending [place] for [facility]

## 2022-07-10 NOTE — MAU Note (Signed)
Pt her ectopic pregnancy and MTX treatment. Reports mild pain rating 1-2. No vag bleeding.

## 2022-07-10 NOTE — Anesthesia Preprocedure Evaluation (Signed)
Anesthesia Evaluation  Patient identified by MRN, date of birth, ID band Patient awake    Reviewed: Allergy & Precautions, NPO status , Patient's Chart, lab work & pertinent test results  Airway Mallampati: III  TM Distance: >3 FB Neck ROM: Full    Dental  (+) Teeth Intact, Dental Advisory Given   Pulmonary asthma (childhood asthma)  Snores at night   Pulmonary exam normal breath sounds clear to auscultation       Cardiovascular negative cardio ROS Normal cardiovascular exam Rhythm:Regular Rate:Normal     Neuro/Psych  PSYCHIATRIC DISORDERS Anxiety Depression    negative neurological ROS     GI/Hepatic negative GI ROS, Neg liver ROS,,,  Endo/Other  negative endocrine ROS    Renal/GU negative Renal ROS  negative genitourinary   Musculoskeletal negative musculoskeletal ROS (+)    Abdominal   Peds  Hematology negative hematology ROS (+) Hb 12.9, plt 344   Anesthesia Other Findings   Reproductive/Obstetrics Ectopic pregnancy                             Anesthesia Physical Anesthesia Plan  ASA: 1  Anesthesia Plan: General   Post-op Pain Management: Tylenol PO (pre-op)*, Toradol IV (intra-op)*, Precedex and Ketamine IV*   Induction: Intravenous  PONV Risk Score and Plan: 4 or greater and Ondansetron, Dexamethasone, Midazolam, Treatment may vary due to age or medical condition and Scopolamine patch - Pre-op  Airway Management Planned: Oral ETT  Additional Equipment: None  Intra-op Plan:   Post-operative Plan: Extubation in OR  Informed Consent: I have reviewed the patients History and Physical, chart, labs and discussed the procedure including the risks, benefits and alternatives for the proposed anesthesia with the patient or authorized representative who has indicated his/her understanding and acceptance.     Dental advisory given  Plan Discussed with: CRNA  Anesthesia Plan  Comments:        Anesthesia Quick Evaluation

## 2022-07-11 ENCOUNTER — Encounter (HOSPITAL_COMMUNITY)
Admission: AD | Disposition: A | Discharge status: Home / Self Care | Payer: Self-pay | Source: Home / Self Care | Attending: Obstetrics and Gynecology

## 2022-07-11 ENCOUNTER — Observation Stay (HOSPITAL_BASED_OUTPATIENT_CLINIC_OR_DEPARTMENT_OTHER): Payer: Medicaid Other | Admitting: Anesthesiology

## 2022-07-11 ENCOUNTER — Encounter (HOSPITAL_COMMUNITY): Payer: Self-pay | Admitting: Obstetrics and Gynecology

## 2022-07-11 ENCOUNTER — Observation Stay (HOSPITAL_COMMUNITY): Payer: Medicaid Other | Admitting: Anesthesiology

## 2022-07-11 ENCOUNTER — Other Ambulatory Visit: Payer: Self-pay

## 2022-07-11 DIAGNOSIS — Z3A01 Less than 8 weeks gestation of pregnancy: Secondary | ICD-10-CM

## 2022-07-11 DIAGNOSIS — O009 Unspecified ectopic pregnancy without intrauterine pregnancy: Secondary | ICD-10-CM | POA: Diagnosis not present

## 2022-07-11 DIAGNOSIS — Z3A00 Weeks of gestation of pregnancy not specified: Secondary | ICD-10-CM | POA: Diagnosis not present

## 2022-07-11 DIAGNOSIS — O00101 Right tubal pregnancy without intrauterine pregnancy: Secondary | ICD-10-CM

## 2022-07-11 HISTORY — PX: DIAGNOSTIC LAPAROSCOPY WITH REMOVAL OF ECTOPIC PREGNANCY: SHX6449

## 2022-07-11 SURGERY — LAPAROSCOPY, WITH ECTOPIC PREGNANCY SURGICAL TREATMENT
Anesthesia: General | Site: Abdomen | Laterality: Right

## 2022-07-11 MED ORDER — ACETAMINOPHEN 500 MG PO TABS
1000.0000 mg | ORAL_TABLET | Freq: Once | ORAL | Status: AC
  Administered 2022-07-11: 1000 mg via ORAL
  Filled 2022-07-11: qty 2

## 2022-07-11 MED ORDER — PHENYLEPHRINE 80 MCG/ML (10ML) SYRINGE FOR IV PUSH (FOR BLOOD PRESSURE SUPPORT)
PREFILLED_SYRINGE | INTRAVENOUS | Status: DC | PRN
  Administered 2022-07-11 (×4): 80 ug via INTRAVENOUS

## 2022-07-11 MED ORDER — FENTANYL CITRATE (PF) 250 MCG/5ML IJ SOLN
INTRAMUSCULAR | Status: DC | PRN
  Administered 2022-07-11: 50 ug via INTRAVENOUS
  Administered 2022-07-11: 25 ug via INTRAVENOUS
  Administered 2022-07-11: 50 ug via INTRAVENOUS

## 2022-07-11 MED ORDER — AMISULPRIDE (ANTIEMETIC) 5 MG/2ML IV SOLN: 10.0000 mg | Freq: Once | INTRAVENOUS | Status: DC | PRN

## 2022-07-11 MED ORDER — SODIUM CHLORIDE 0.9 % IR SOLN
Status: DC | PRN
  Administered 2022-07-11: 1000 mL

## 2022-07-11 MED ORDER — OXYCODONE HCL 5 MG PO TABS
5.0000 mg | ORAL_TABLET | Freq: Once | ORAL | Status: AC | PRN
  Administered 2022-07-11: 5 mg via ORAL

## 2022-07-11 MED ORDER — ONDANSETRON HCL 4 MG/2ML IJ SOLN: 4.0000 mg | Freq: Once | INTRAMUSCULAR | Status: DC | PRN

## 2022-07-11 MED ORDER — HYDROMORPHONE HCL 1 MG/ML IJ SOLN
INTRAMUSCULAR | Status: AC
  Filled 2022-07-11: qty 1

## 2022-07-11 MED ORDER — PROPOFOL 10 MG/ML IV BOLUS
INTRAVENOUS | Status: AC
  Filled 2022-07-11: qty 20

## 2022-07-11 MED ORDER — HYDROMORPHONE HCL 1 MG/ML IJ SOLN
0.2500 mg | INTRAMUSCULAR | Status: DC | PRN
  Administered 2022-07-11 (×3): 0.5 mg via INTRAVENOUS

## 2022-07-11 MED ORDER — MIDAZOLAM HCL 2 MG/2ML IJ SOLN
INTRAMUSCULAR | Status: DC | PRN
  Administered 2022-07-11: 2 mg via INTRAVENOUS

## 2022-07-11 MED ORDER — CHLORHEXIDINE GLUCONATE 0.12 % MT SOLN
15.0000 mL | Freq: Once | OROMUCOSAL | Status: AC
  Administered 2022-07-11: 15 mL via OROMUCOSAL
  Filled 2022-07-11: qty 15

## 2022-07-11 MED ORDER — PROTAMINE SULFATE 10 MG/ML IV SOLN
INTRAVENOUS | Status: AC
  Filled 2022-07-11: qty 5

## 2022-07-11 MED ORDER — OXYCODONE HCL 5 MG/5ML PO SOLN: 5.0000 mg | Freq: Once | ORAL | Status: AC | PRN

## 2022-07-11 MED ORDER — KETOROLAC TROMETHAMINE 30 MG/ML IJ SOLN: 30.0000 mg | Freq: Once | INTRAMUSCULAR | Status: DC | PRN

## 2022-07-11 MED ORDER — FENTANYL CITRATE (PF) 250 MCG/5ML IJ SOLN
INTRAMUSCULAR | Status: AC
  Filled 2022-07-11: qty 5

## 2022-07-11 MED ORDER — LIDOCAINE 2% (20 MG/ML) 5 ML SYRINGE
INTRAMUSCULAR | Status: DC | PRN
  Administered 2022-07-11: 50 mg via INTRAVENOUS

## 2022-07-11 MED ORDER — PROPOFOL 10 MG/ML IV BOLUS
INTRAVENOUS | Status: DC | PRN
  Administered 2022-07-11: 150 mg via INTRAVENOUS

## 2022-07-11 MED ORDER — OXYCODONE HCL 5 MG PO TABS: 5.0000 mg | ORAL_TABLET | ORAL | 0 refills | Status: DC | PRN

## 2022-07-11 MED ORDER — SUGAMMADEX SODIUM 200 MG/2ML IV SOLN
INTRAVENOUS | Status: DC | PRN
  Administered 2022-07-11: 200 mg via INTRAVENOUS

## 2022-07-11 MED ORDER — OXYCODONE HCL 5 MG PO TABS
ORAL_TABLET | ORAL | Status: AC
  Filled 2022-07-11: qty 1

## 2022-07-11 MED ORDER — ROCURONIUM BROMIDE 10 MG/ML (PF) SYRINGE
PREFILLED_SYRINGE | INTRAVENOUS | Status: DC | PRN
  Administered 2022-07-11: 60 mg via INTRAVENOUS

## 2022-07-11 MED ORDER — IBUPROFEN 600 MG PO TABS: 600.0000 mg | ORAL_TABLET | Freq: Four times a day (QID) | ORAL | 3 refills | Status: DC | PRN

## 2022-07-11 MED ORDER — CEFAZOLIN SODIUM-DEXTROSE 2-3 GM-%(50ML) IV SOLR
INTRAVENOUS | Status: DC | PRN
  Administered 2022-07-11: 2 g via INTRAVENOUS

## 2022-07-11 MED ORDER — MEPERIDINE HCL 25 MG/ML IJ SOLN: 6.2500 mg | INTRAMUSCULAR | Status: DC | PRN

## 2022-07-11 MED ORDER — ORAL CARE MOUTH RINSE: 15.0000 mL | Freq: Once | OROMUCOSAL | Status: AC

## 2022-07-11 MED ORDER — ONDANSETRON HCL 4 MG/2ML IJ SOLN
INTRAMUSCULAR | Status: DC | PRN
  Administered 2022-07-11: 4 mg via INTRAVENOUS

## 2022-07-11 MED ORDER — MIDAZOLAM HCL 2 MG/2ML IJ SOLN
INTRAMUSCULAR | Status: AC
  Filled 2022-07-11: qty 2

## 2022-07-11 MED ORDER — SCOPOLAMINE 1 MG/3DAYS TD PT72
1.0000 | MEDICATED_PATCH | TRANSDERMAL | Status: DC
  Administered 2022-07-11: 1.5 mg via TRANSDERMAL
  Filled 2022-07-11: qty 1

## 2022-07-11 MED ORDER — KETAMINE HCL 10 MG/ML IJ SOLN
INTRAMUSCULAR | Status: DC | PRN
  Administered 2022-07-11: 20 mg via INTRAVENOUS

## 2022-07-11 MED ORDER — CEFAZOLIN SODIUM-DEXTROSE 2-4 GM/100ML-% IV SOLN
INTRAVENOUS | Status: AC
  Filled 2022-07-11: qty 100

## 2022-07-11 MED ORDER — KETAMINE HCL 50 MG/5ML IJ SOSY
PREFILLED_SYRINGE | INTRAMUSCULAR | Status: AC
  Filled 2022-07-11: qty 5

## 2022-07-11 MED ORDER — KETOROLAC TROMETHAMINE 30 MG/ML IJ SOLN
INTRAMUSCULAR | Status: DC | PRN
  Administered 2022-07-11: 30 mg via INTRAVENOUS

## 2022-07-11 MED ORDER — BUPIVACAINE HCL 0.25 % IJ SOLN
INTRAMUSCULAR | Status: DC | PRN
  Administered 2022-07-11: 17 mL

## 2022-07-11 MED ORDER — BUPIVACAINE HCL (PF) 0.25 % IJ SOLN
INTRAMUSCULAR | Status: AC
  Filled 2022-07-11: qty 30

## 2022-07-11 MED ORDER — LACTATED RINGERS IV SOLN: INTRAVENOUS | Status: DC

## 2022-07-11 MED ORDER — DEXMEDETOMIDINE HCL IN NACL 200 MCG/50ML IV SOLN
INTRAVENOUS | Status: DC | PRN
  Administered 2022-07-11: 4 ug via INTRAVENOUS

## 2022-07-11 SURGICAL SUPPLY — 35 items
APPLICATOR ARISTA FLEXITIP XL (MISCELLANEOUS) IMPLANT
CNTNR URN SCR LID CUP LEK RST (MISCELLANEOUS) IMPLANT
CONT SPEC 4OZ STRL OR WHT (MISCELLANEOUS) ×1
DERMABOND ADVANCED .7 DNX12 (GAUZE/BANDAGES/DRESSINGS) ×1 IMPLANT
DERMABOND ADVANCED .7 DNX6 (GAUZE/BANDAGES/DRESSINGS) IMPLANT
DRSG OPSITE POSTOP 3X4 (GAUZE/BANDAGES/DRESSINGS) ×1 IMPLANT
DURAPREP 26ML APPLICATOR (WOUND CARE) ×1 IMPLANT
GLOVE BIOGEL PI IND STRL 8 (GLOVE) ×1 IMPLANT
GLOVE SURG ORTHO 8.0 STRL STRW (GLOVE) ×1 IMPLANT
GOWN STRL REUS W/ TWL LRG LVL3 (GOWN DISPOSABLE) ×1 IMPLANT
GOWN STRL REUS W/ TWL XL LVL3 (GOWN DISPOSABLE) ×1 IMPLANT
GOWN STRL REUS W/TWL LRG LVL3 (GOWN DISPOSABLE) ×1
GOWN STRL REUS W/TWL XL LVL3 (GOWN DISPOSABLE) ×1
HEMOSTAT ARISTA ABSORB 3G PWDR (HEMOSTASIS) IMPLANT
IRRIG SUCT STRYKERFLOW 2 WTIP (MISCELLANEOUS)
IRRIGATION SUCT STRKRFLW 2 WTP (MISCELLANEOUS) IMPLANT
KIT TURNOVER KIT B (KITS) ×1 IMPLANT
LIGASURE VESSEL 5MM BLUNT TIP (ELECTROSURGICAL) ×1 IMPLANT
NS IRRIG 1000ML POUR BTL (IV SOLUTION) ×1 IMPLANT
PACK LAPAROSCOPY BASIN (CUSTOM PROCEDURE TRAY) ×1 IMPLANT
PACK TRENDGUARD 450 HYBRID PRO (MISCELLANEOUS) ×1 IMPLANT
PROTECTOR NERVE ULNAR (MISCELLANEOUS) ×2 IMPLANT
SET TUBE SMOKE EVAC HIGH FLOW (TUBING) ×1 IMPLANT
SLEEVE XCEL OPT CAN 5 100 (ENDOMECHANICALS) ×1 IMPLANT
SUT MNCRL AB 4-0 PS2 18 (SUTURE) ×1 IMPLANT
SUT VICRYL 0 UR6 27IN ABS (SUTURE) ×1 IMPLANT
SYS BAG RETRIEVAL 10MM (BASKET)
SYSTEM BAG RETRIEVAL 10MM (BASKET) IMPLANT
TOWEL GREEN STERILE FF (TOWEL DISPOSABLE) ×2 IMPLANT
TRAY FOLEY W/BAG SLVR 14FR (SET/KITS/TRAYS/PACK) ×1 IMPLANT
TRENDGUARD 450 HYBRID PRO PACK (MISCELLANEOUS) ×1
TROCAR 11X100 Z THREAD (TROCAR) ×1 IMPLANT
TROCAR BALLN 12MMX100 BLUNT (TROCAR) IMPLANT
TROCAR XCEL NON-BLD 5MMX100MML (ENDOMECHANICALS) ×1 IMPLANT
WARMER LAPAROSCOPE (MISCELLANEOUS) ×1 IMPLANT

## 2022-07-11 NOTE — Clinical Note (Incomplete)
Kristy Miller [phone]

## 2022-07-11 NOTE — Op Note (Signed)
Junious Dresser PROCEDURE DATE: 07/11/2022  PREOPERATIVE DIAGNOSES: right suspected ectopic "(twin) pregnancy POSTOPERATIVE DIAGNOSES: The same PROCEDURE: operative laparoscopy, right salpingectomy SURGEON: Lynnda Shields, MD ASSISTANT: Darron Doom, MD ANESTHESIOLOGY TEAM: Anesthesiologist: Pervis Hocking, DO CRNA: Valda Favia, CRNA; Mariea Clonts, CRNA  INDICATIONS: 27 y.o. [phone] with aforementioned preoperative diagnoses here today for definitive surgical management.   Risks of surgery were discussed with the patient including but not limited to: bleeding which may require transfusion or reoperation; infection which may require antibiotics; injury to bowel, bladder, ureters or other surrounding organs; need for additional procedures including laparotomy; thromboembolic phenomenon, incisional problems and other postoperative/anesthesia complications. Written informed consent was obtained.     An experienced assistant was required given the standard of surgical care given the complexity of the case.  This assistant was needed for exposure, dissection, suctioning, retraction, instrument exchange, and for overall help during the procedure.  FINDINGS:   No evidence of endometriosis, adhesions or any other abdominal/pelvic abnormality.  Normal upper abdomen.  Obvious right ectopic pregnancy close to the cornual region.  No hemoperitoneum  ANESTHESIA:  General anesthesia INTRAVENOUS FLUIDS: 1000 ml ESTIMATED BLOOD LOSS: 10 ml UOP: 300 ml SPECIMENS:  right tube with ectopic pregnancy COMPLICATIONS: None immediate   PROCEDURE IN DETAIL:  The patient received intravenous antibiotics and had sequential compression devices applied to her lower extremities while in the preoperative area.  She was then taken to the operating room where general anesthesia was administered and was found to be adequate.  She was placed in the dorsal lithotomy position, and was prepped and draped in a  sterile manner.  A Foley catheter was inserted into her bladder and attached to constant drainage. A timeout was performed to verify patient and procedure. A uterine manipulator was then advanced into the uterus. Attention was then turned to the patient's abdomen where a 5-mm skin incision was made in the umbilical fold.  Dissection continued with meticulous sharp dissection until the abdominal cavity was fully entered.  A 10 mm balloon trochar was placed and the balloon was inflated.  Pneumoperitoneum was obtained to 15 mm Hg and the camera was introduced and survey of the abdomen showed atraumatic entry. Survey of the abdomen and pelvis were as noted above.   A 5 mm port was placed into the left lower abdomen 2 cm proximal and median from the left ASIS after incision with a scalpel. The port was advanced under direct visualization.  A 5 mm port was placed in the right lower abdomen 2 cm proximal and medical to the right ASIS after incision with the scalpel. The port was advanced under direct visualization.     The patient was placed into trendelenburg and the right tube was grasped using an atraumatic grasper. The fallopian tube was removed using a 5 mm Ligasure to ligate and transect along the mesosalpinx just inferior to the tube. Hemostasis noted at ligation site. The tube was placed into a 10 mm bag and removed from the abdomen. The left tube and ovary were inspected and noted to be normal. The abdomen was again inspected and no bleeding or other abnormalities noted. At this point the procedure was completed and the trocars were removed from the abdomen. The pneumoperitoneum was removed as much as possible. The fascia of the central 10 mm port was closed using 0-Vicryl. The skin of all ports was closed using 4-0 Monocryl. The uterine manipulator was removed and no bleeding noted at the site of the tenaculum.  The patient was extubated without difficulty and taken to PACU in stable condition.   The  patient will be discharged to home as per PACU criteria.  Routine postoperative instructions given.  She was prescribed oxycodone and Ibuprofen.  She will follow up in the clinic in 2-3 weeks for postoperative evaluation.   Lynnda Shields, MD

## 2022-07-11 NOTE — Interval H&P Note (Signed)
History and Physical Interval Note:  07/11/2022 9:08 AM  Kristy Miller  has presented today for surgery, with the diagnosis of Ectopic pregnancy.  The various methods of treatment have been discussed with the patient and family. After consideration of risks, benefits and other options for treatment, the patient has consented to  Procedure(s): DIAGNOSTIC LAPAROSCOPY WITH REMOVAL OF ECTOPIC PREGNANCY, POSSIBLE RIGHT SALPINGECTOMY (Right) as a surgical intervention.  The patient's history has been reviewed, patient examined, no change in status, stable for surgery.  I have reviewed the patient's chart and labs.  Questions were answered to the patient's satisfaction.     [name]

## 2022-07-11 NOTE — Transfer of Care (Signed)
Immediate Anesthesia Transfer of Care Note  Patient: Kristy Miller  Procedure(s) Performed: DIAGNOSTIC LAPAROSCOPY WITH REMOVAL OF ECTOPIC PREGNANCY (Right: Abdomen)  Patient Location: PACU  Anesthesia Type:General  Level of Consciousness: awake, alert , and oriented  Airway & Oxygen Therapy: Patient Spontanous Breathing and Patient connected to nasal cannula oxygen  Post-op Assessment: Report given to RN and Post -op Vital signs reviewed and stable  Post vital signs: Reviewed and stable  Last Vitals:  Vitals Value Taken Time  BP 128/84 07/11/22 1142  Temp 36.6 C 07/11/22 1142  Pulse 86 07/11/22 1144  Resp 15 07/11/22 1144  SpO2 100 % 07/11/22 1144  Vitals shown include unvalidated device data.  Last Pain:  Vitals:   07/11/22 0854  TempSrc:   PainSc: 0-No pain         Complications: No notable events documented.

## 2022-07-11 NOTE — Anesthesia Postprocedure Evaluation (Signed)
Anesthesia Post Note  Patient: Kristy Miller  Procedure(s) Performed: DIAGNOSTIC LAPAROSCOPY WITH REMOVAL OF ECTOPIC PREGNANCY (Right: Abdomen)     Patient location during evaluation: PACU Anesthesia Type: General Level of consciousness: awake and alert, oriented and patient cooperative Pain management: pain level controlled Vital Signs Assessment: post-procedure vital signs reviewed and stable Respiratory status: spontaneous breathing, nonlabored ventilation and respiratory function stable Cardiovascular status: blood pressure returned to baseline and stable Postop Assessment: no apparent nausea or vomiting Anesthetic complications: no   No notable events documented.  Last Vitals:  Vitals:   07/11/22 1215 07/11/22 1230  BP: 134/87 114/73  Pulse: 61 60  Resp: 11 13  Temp:    SpO2: 99% 100%    Last Pain:  Vitals:   07/11/22 1215  TempSrc:   PainSc: [place]

## 2022-07-11 NOTE — Discharge Summary (Signed)
Discharge Summary   Admit Date: 07/10/2022 Discharge Date: 07/11/2022 Discharging Service: OBGYN  Primary OBGYN: unassigned Admitting Physician: [name], MD  Discharge Physician: Lynnda Shields, MD  Referring Provider: n/a  Primary Care Provider: Patient, No Pcp Per  Admission Diagnoses: Ectopic pregnancy [O00.90]   Discharge Diagnoses: Right ectopic pregnancy, unruptured  Consult Orders: None   Surgeries/Procedures Performed: Operative laparoscopy, right salpingectomy   Hospital Course: Pt was admitted after ultrasound diagnosis of right twin ectopic pregnancy.  Pt had eaten a full meal so she was kept overnight with the plan for surgical procedure the next day.  The patient was stable overnight and had successful surgery on 07/11/22.  Please see separate operative note.  She was discharged home directly from the PACU.  Discharge Exam:  Vitals:   07/11/22 1200 07/11/22 1215 07/11/22 1230 07/11/22 1245  BP: (!) 136/92 134/87 114/73 121/79  Pulse: 71 61 60 60  Resp: '10 11 13 12  '$ Temp:    97.8 F (36.6 C)  TempSrc:      SpO2: 100% 99% 100% 100%  Weight:      Height:        Temp:  [97.8 F (36.6 C)-98.8 F (37.1 C)] 97.8 F (36.6 C) (02/27 1245) Pulse Rate:  [60-95] 60 (02/27 1245) Resp:  [10-18] 12 (02/27 1245) BP: (114-136)/(68-102) 121/79 (02/27 1245) SpO2:  [99 %-100 %] 100 % (02/27 1245) I/O last 3 completed shifts: In: [id number] [I.V.:112.3; IV Piggyback:916.7] Out: -  Total I/O In: 1050 [I.V.:1000; IV Piggyback:50] Out: 310 [Urine:300; Blood:10]  Intake/Output Summary (Last 24 hours) at 07/11/2022 1844 Last data filed at 07/11/2022 1126 Gross per 24 hour  Intake 1050 ml  Output 310 ml  Net 740 ml     Current Vital Signs 24h Vital Sign Ranges  T 97.8 F (36.6 C) Temp  Avg: 98.1 F (36.7 C)  Min: 97.8 F (36.6 C)  Max: 98.8 F (37.1 C)  BP 121/79 BP  Min: 114/73  Max: 136/92  HR 60 Pulse  Avg: 75.8  Min: 60  Max: 95  RR 12 Resp  Avg: 14.2   Min: 10  Max: 18  SaO2 100 % Room Air SpO2  Avg: 99.9 %  Min: 99 %  Max: 100 %       24 Hour I/O Current Shift I/O  Time Ins Outs 02/26 0701 - 02/27 0700 In: [id number] [I.V.:112.3] Out: -  02/27 0701 - 02/27 1900 In: 1050 [I.V.:1000] Out: 310 [Urine:300]   Patient Vitals for the past 12 hrs:  BP Temp Temp src Pulse Resp SpO2  07/11/22 1245 121/79 97.8 F (36.6 C) -- 60 12 100 %  07/11/22 1230 114/73 -- -- 60 13 100 %  07/11/22 1215 134/87 -- -- 61 11 99 %  07/11/22 1200 (!) 136/92 -- -- 71 10 100 %  07/11/22 1145 (!) 133/102 -- -- 86 15 100 %  07/11/22 1142 128/84 97.8 F (36.6 C) -- 89 18 100 %  07/11/22 0854 125/85 -- -- 84 17 100 %  07/11/22 0700 125/68 98.8 F (37.1 C) Oral 76 16 100 %    Patient Vitals for the past 6 hrs:  BP Temp Pulse Resp SpO2  07/11/22 1245 121/79 97.8 F (36.6 C) 60 12 100 %     Patient Vitals for the past 24 hrs:  BP Temp Temp src Pulse Resp SpO2  07/11/22 1245 121/79 97.8 F (36.6 C) -- 60 12 100 %  07/11/22 1230 114/73 -- --  60 13 100 %  07/11/22 1215 134/87 -- -- 61 11 99 %  07/11/22 1200 (!) 136/92 -- -- 71 10 100 %  07/11/22 1145 (!) 133/102 -- -- 86 15 100 %  07/11/22 1142 128/84 97.8 F (36.6 C) -- 89 18 100 %  07/11/22 0854 125/85 -- -- 84 17 100 %  07/11/22 0700 125/68 98.8 F (37.1 C) Oral 76 16 100 %  07/11/22 0611 125/86 98 F (36.7 C) Oral 95 16 100 %    General appearance: Well nourished, well developed female in no acute distress.  Neck:  Supple, normal appearance, and no thyromegaly  Respiratory:  Clear to auscultation bilateral. Normal respiratory effort Abdomen: positive bowel sounds and no masses, hernias; diffusely non tender to palpation, non distended Neuro/Psych:  Normal mood and affect.  Skin:  Warm and dry.  Pelvic exam: deferred  Discharge Disposition:  Home  Patient Instructions:  Light diet after discharged on DOS. Pelvic rest 2-3 weeks    Discharge Medications: Allergies as of 07/11/2022   No Known  Allergies      Medication List     TAKE these medications    ibuprofen 600 MG tablet Commonly known as: ADVIL Take 1 tablet (600 mg total) by mouth every 6 (six) hours as needed.   oxyCODONE 5 MG immediate release tablet Commonly known as: Roxicodone Take 1 tablet (5 mg total) by mouth every 4 (four) hours as needed for severe pain.         Future Appointments  Date Time Provider [place]  08/02/2022  9:55 AM Woodroe Mode, MD [facility]    Lynnda Shields, MD Attending [facility] [place] [facility])

## 2022-07-11 NOTE — Anesthesia Procedure Notes (Signed)
Procedure Name: Intubation Date/Time: 07/11/2022 10:16 AM  Performed by: Mariea Clonts, CRNAPre-anesthesia Checklist: Patient identified, Emergency Drugs available, Suction available and Patient being monitored Patient Re-evaluated:Patient Re-evaluated prior to induction Oxygen Delivery Method: Circle System Utilized Preoxygenation: Pre-oxygenation with 100% oxygen Induction Type: IV induction Ventilation: Mask ventilation without difficulty Laryngoscope Size: Miller and 2 Grade View: Grade I Tube type: Oral Tube size: 7.0 mm Number of attempts: 1 Airway Equipment and Method: Stylet and Oral airway Placement Confirmation: ETT inserted through vocal cords under direct vision, positive ETCO2 and breath sounds checked- equal and bilateral Tube secured with: Tape Dental Injury: Teeth and Oropharynx as per pre-operative assessment

## 2022-07-12 ENCOUNTER — Encounter (HOSPITAL_COMMUNITY): Payer: Self-pay | Admitting: Obstetrics and Gynecology

## 2022-07-12 LAB — SURGICAL PATHOLOGY

## 2022-07-29 ENCOUNTER — Other Ambulatory Visit: Payer: Self-pay

## 2022-07-29 ENCOUNTER — Emergency Department (HOSPITAL_COMMUNITY)
Admission: EM | Admit: 2022-07-29 | Discharge: 2022-07-30 | Disposition: A | Discharge status: Home / Self Care | Payer: Medicaid Other | Attending: Emergency Medicine | Admitting: Emergency Medicine

## 2022-07-29 ENCOUNTER — Encounter (HOSPITAL_COMMUNITY): Payer: Self-pay | Admitting: *Deleted

## 2022-07-29 DIAGNOSIS — N898 Other specified noninflammatory disorders of vagina: Secondary | ICD-10-CM | POA: Diagnosis present

## 2022-07-29 NOTE — Discharge Instructions (Signed)
Follow up with OB as scheduled 08/02/2022 Your swab is negative for BV, yeast, trich. Your second swab (gonorrhea, chlamydia) will result in 3-5 days and will be available in your mychart account.   Return for abdominal pain, fever, worsening or concerning symptoms.

## 2022-07-29 NOTE — ED Triage Notes (Signed)
3 weeks ago the pt had surgery for twin ectopic pregnancy s  since then she has been leaking a white fluid  no periods since the surgery

## 2022-07-29 NOTE — ED Provider Notes (Signed)
[place] Provider Note   CSN: [id number] Arrival date & time: 07/29/22  2139     History {Add pertinent medical, surgical, social history, OB history to HPI:1} Chief Complaint  Patient presents with   Generalized Body Aches    Kristy Miller is a 27 y.o. female.  27 year old female presents with concern for white vaginal discharge/leaking fluid vaginally.  States that she is 2 to 3 weeks postop from operative laparoscopy, right salpingectomy twin pregnancy.  Denies fevers, chills, weakness or abdominal pain or any other complaints or concerns.       Home Medications Prior to Admission medications   Medication Sig Start Date End Date Taking? Authorizing Provider  ibuprofen (ADVIL) 600 MG tablet Take 1 tablet (600 mg total) by mouth every 6 (six) hours as needed. 07/11/22   [name], MD  oxyCODONE (ROXICODONE) 5 MG immediate release tablet Take 1 tablet (5 mg total) by mouth every 4 (four) hours as needed for severe pain. 07/11/22   [name], MD      Allergies    Patient has no known allergies.    Review of Systems   Review of Systems Negative except as per HPI Physical Exam Updated Vital Signs BP (!) 150/106 (BP Location: Right Arm)   Pulse 67   Temp 99.2 F (37.3 C) (Oral)   Resp 18   Ht 5\' 4"  (1.626 m)   Wt 58.5 kg   LMP 05/22/2022 (Approximate)   SpO2 100%   Breastfeeding Unknown   BMI 22.14 kg/m  Physical Exam Vitals and nursing note reviewed.  Constitutional:      General: She is not in acute distress.    Appearance: She is well-developed. She is not diaphoretic.  HENT:     Head: Normocephalic and atraumatic.  Pulmonary:     Effort: Pulmonary effort is normal.  Abdominal:     Palpations: Abdomen is soft.     Tenderness: There is no abdominal tenderness.  Skin:    General: Skin is warm and dry.     Findings: No erythema or rash.  Neurological:     Mental Status: She is alert and oriented  to person, place, and time.  Psychiatric:        Behavior: Behavior normal.     ED Results / Procedures / Treatments   Labs (all labs ordered are listed, but only abnormal results are displayed) Labs Reviewed - No data to display  EKG None  Radiology No results found.  Procedures Procedures  {Document cardiac monitor, telemetry assessment procedure when appropriate:1}  Medications Ordered in ED Medications - No data to display  ED Course/ Medical Decision Making/ A&P   {   Click here for ABCD2, HEART and other calculatorsREFRESH Note before signing :1}                          Medical Decision Making  27 year old female presents with concern as above.  Discussed with Heather, MAU APP, does not require transfer to MAU for evaluation but can self collect wet prep/GC and follow-up in clinic as scheduled on March 20.  {Document critical care time when appropriate:1} {Document review of labs and clinical decision tools ie heart score, Chads2Vasc2 etc:1}  {Document your independent review of radiology images, and any outside records:1} {Document your discussion with family members, caretakers, and with consultants:1} {Document social determinants of health affecting pt's care:1} {Document your decision making  why or why not admission, treatments were needed:1} Final Clinical Impression(s) / ED Diagnoses Final diagnoses:  None    Rx / DC Orders ED Discharge Orders     None

## 2022-07-30 LAB — WET PREP, GENITAL
Clue Cells Wet Prep HPF POC: NONE SEEN
Sperm: NONE SEEN
Trich, Wet Prep: NONE SEEN
WBC, Wet Prep HPF POC: <10 (ref <10)
Yeast Wet Prep HPF POC: NONE SEEN

## 2022-07-30 NOTE — ED Notes (Signed)
Patient verbalized understanding of discharge instructions and reasons to return to the ED 

## 2022-07-31 LAB — GC/CHLAMYDIA PROBE AMP (~~LOC~~) NOT AT ARMC
Chlamydia: NEGATIVE
Comment: NEGATIVE
Comment: NORMAL
Neisseria Gonorrhea: NEGATIVE

## 2022-08-02 ENCOUNTER — Ambulatory Visit (INDEPENDENT_AMBULATORY_CARE_PROVIDER_SITE_OTHER): Payer: Medicaid Other | Admitting: Obstetrics & Gynecology

## 2022-08-02 ENCOUNTER — Other Ambulatory Visit: Payer: Self-pay

## 2022-08-02 VITALS — BP 127/91 | HR 64 | Wt 127.3 lb

## 2022-08-02 DIAGNOSIS — Z09 Encounter for follow-up examination after completed treatment for conditions other than malignant neoplasm: Secondary | ICD-10-CM

## 2022-08-02 DIAGNOSIS — Z3A01 Less than 8 weeks gestation of pregnancy: Secondary | ICD-10-CM

## 2022-08-02 DIAGNOSIS — O00101 Right tubal pregnancy without intrauterine pregnancy: Secondary | ICD-10-CM

## 2022-08-02 NOTE — Progress Notes (Signed)
Subjective:     Kristy Miller is a 27 y.o. female who presents to the clinic 4 weeks status post  Right diagnostic laparoscopy with removal of ectopic pregnancy  for  ectopic pregnancy . Eating a regular diet without difficulty. Bowel movements are normal. The patient is not having any pain.  The following portions of the patient's history were reviewed and updated as appropriate: allergies, current medications, past family history, past medical history, past social history, and past surgical history.  Review of Systems Constitutional: negative HEENT: pt has HA that come and go and is relieved with tylenol Respiratory: negative Cardiovascular: negative Gastrointestinal: pt states some sharp abdominal pain with movement that goes away on own Genitourinary:negative  Derm: pt states she has eczema and has a known history of it. Pt has been applying Aveeno to affected areas.   Objective:    BP (!) 127/91   Pulse 64   Wt 127 lb 4.8 oz (57.7 kg)   LMP 05/22/2022 (Approximate)   BMI 21.85 kg/m  General:  alert, cooperative, and appears stated age  Abdomen: soft, bowel sounds active, non-tender  Incision:   healing well, no drainage, no erythema, no hernia, no seroma, no swelling, no dehiscence, incision well approximated     Assessment:    Doing well postoperatively. Operative findings again reviewed. Pathology report discussed.    Plan:    1. Continue any current medications. 2. Wound care discussed. 3. Activity restrictions: none 4. Anticipated return to work:  patient states she has already gone back to work with no concerns . 5. Follow up: 3  months  for an annual with pap.  6. Recommend pt to follow-up with PCP regarding eczema and high BP   Attestation of Attending Supervision of PA Student: Evaluation and management procedures were performed by the PA student under my supervision and collaboration.  I have reviewed the student's note and chart, and I agree with the  management and plan.  Emeterio Reeve, MD, [place] Attending [place], [facility]

## 2022-08-13 ENCOUNTER — Ambulatory Visit (HOSPITAL_COMMUNITY)
Admission: EM | Admit: 2022-08-13 | Discharge: 2022-08-13 | Disposition: A | Discharge status: Home / Self Care | Payer: Medicaid Other | Attending: Internal Medicine | Admitting: Internal Medicine

## 2022-08-13 ENCOUNTER — Encounter (HOSPITAL_COMMUNITY): Payer: Self-pay

## 2022-08-13 DIAGNOSIS — J111 Influenza due to unidentified influenza virus with other respiratory manifestations: Secondary | ICD-10-CM | POA: Insufficient documentation

## 2022-08-13 DIAGNOSIS — Z1152 Encounter for screening for COVID-19: Secondary | ICD-10-CM | POA: Diagnosis not present

## 2022-08-13 DIAGNOSIS — R509 Fever, unspecified: Secondary | ICD-10-CM | POA: Insufficient documentation

## 2022-08-13 LAB — POCT RAPID STREP A, ED / UC: Streptococcus, Group A Screen (Direct): NEGATIVE

## 2022-08-13 MED ORDER — ONDANSETRON 4 MG PO TBDP: 4.0000 mg | ORAL_TABLET | Freq: Three times a day (TID) | ORAL | 0 refills | Status: DC | PRN

## 2022-08-13 MED ORDER — ONDANSETRON 4 MG PO TBDP: 4.0000 mg | ORAL_TABLET | Freq: Three times a day (TID) | ORAL | 0 refills | Status: AC | PRN

## 2022-08-13 MED ORDER — ACETAMINOPHEN 325 MG PO TABS
ORAL_TABLET | ORAL | Status: AC
  Filled 2022-08-13: qty 2

## 2022-08-13 MED ORDER — OSELTAMIVIR PHOSPHATE 75 MG PO CAPS: 75.0000 mg | ORAL_CAPSULE | Freq: Two times a day (BID) | ORAL | 0 refills | Status: DC

## 2022-08-13 MED ORDER — BENZONATATE 100 MG PO CAPS: 100.0000 mg | ORAL_CAPSULE | Freq: Three times a day (TID) | ORAL | 0 refills | Status: DC

## 2022-08-13 MED ORDER — ACETAMINOPHEN 325 MG PO TABS
650.0000 mg | ORAL_TABLET | Freq: Once | ORAL | Status: AC
  Administered 2022-08-13: 650 mg via ORAL

## 2022-08-13 NOTE — ED Triage Notes (Signed)
With body aches and chills since last night. Fever last night. Symptoms began on Friday "at work" with chills. Also body aches. Some congestion, runny nose. No new/unexplained rash.

## 2022-08-13 NOTE — ED Provider Notes (Signed)
[place]    CSN: [id number] Arrival date & time: 08/13/22  1718      History   Chief Complaint Chief Complaint  Patient presents with   Sore Throat    HPI Kristy Miller is a 27 y.o. female.   Patient presents to urgent care for evaluation of sore throat, cough, nasal congestion, slight generalized headache, decreased appetite, and fever/chills that started abruptly 2 days ago on Friday August 11, 2022. Tolerating food and fluids without nausea, vomiting, diarrhea, or abdominal pain. No documented fever at home but reports significant chills. 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 He came back from the hospital with cough/runny nose. No other known sick contacts. Cough is worse at nighttime. Non-smoker, denies drug use. Not vaccinated against influenza or COVID-19. No recent COVID-19 illnesses. History of asthma but states she has not experienced asthma exacerbation since she was a child and no longer uses albuterol inhaler. No shortness of breath, wheeze, chest pain, or heart palpitations. Whole body hurts. Has been using tylenol and ibuprofen as needed. Last dose of antipyretic was greater than 6 hours ago. Currently febrile at 102.7.    Sore Throat    Past Medical History:  Diagnosis Date   Anxiety    Asthma    nothing after age 56   Depression    doing good   Eczema    UTI (urinary tract infection)     Patient Active Problem List   Diagnosis Date Noted   Ectopic pregnancy 07/10/2022   H/O polyhydramnios 12/10/2014   History of gestational hypertension 12/10/2014   Anemia 08/03/2014   Mild intermittent asthma without complication [id number]    Past Surgical History:  Procedure Laterality Date   DIAGNOSTIC LAPAROSCOPY WITH REMOVAL OF ECTOPIC PREGNANCY Right 07/11/2022   Procedure: DIAGNOSTIC LAPAROSCOPY WITH REMOVAL OF ECTOPIC PREGNANCY;  Surgeon: [name], MD;  Location: [facility];  Service: Gynecology;  Laterality:  Right;   HERNIA REPAIR     umbilical, age 2   OTHER SURGICAL HISTORY     age 31 , surgery on abd? not sure     OB History     Gravida  3   Para  1   Term  1   Preterm      AB  1   Living  1      SAB  1   IAB      Ectopic      Multiple  0   Live Births  1        Obstetric Comments  BP started going up at end of pregnancy.           Home Medications    Prior to Admission medications   Medication Sig Start Date End Date Taking? Authorizing Provider  ibuprofen (ADVIL) 600 MG tablet Take 1 tablet (600 mg total) by mouth every 6 (six) hours as needed. 07/11/22  Yes [name], MD  oseltamivir (TAMIFLU) 75 MG capsule Take 1 capsule (75 mg total) by mouth every 12 (twelve) hours. 08/13/22  Yes Talbot Grumbling, FNP  benzonatate (TESSALON) 100 MG capsule Take 1 capsule (100 mg total) by mouth every 8 (eight) hours. 08/13/22   Talbot Grumbling, FNP  diclofenac (VOLTAREN) 75 MG EC tablet Take by mouth. 12/05/19   [provider]  ondansetron (ZOFRAN-ODT) 4 MG disintegrating tablet Take 1 tablet (4 mg total) by mouth every 8 (eight) hours as needed for nausea  or vomiting. 08/13/22   Talbot Grumbling, FNP  oxyCODONE (ROXICODONE) 5 MG immediate release tablet Take 1 tablet (5 mg total) by mouth every 4 (four) hours as needed for severe pain. 07/11/22   [name], MD    Family History Family History  Adopted: Yes    Social History Social History   Tobacco Use   Smoking status: Never   Smokeless tobacco: Never   Tobacco comments:    Quit vaping  with + preg  Vaping Use   Vaping Use: Some days   Substances: Nicotine, Flavoring  Substance Use Topics   Alcohol use: Yes    Comment: Rarely   Drug use: Not Currently    Types: Marijuana    Comment: none- quit early 2023     Allergies   Patient has no known allergies.   Review of Systems Review of Systems Per HPI  Physical Exam Triage Vital Signs ED Triage Vitals  Enc  Vitals Group     BP 08/13/22 1750 116/81     Pulse Rate 08/13/22 1750 94     Resp 08/13/22 1750 18     Temp 08/13/22 1750 (!) 102.7 F (39.3 C)     Temp Source 08/13/22 1750 Oral     SpO2 08/13/22 1750 98 %     Weight 08/13/22 1747 127 lb (57.6 kg)     Height 08/13/22 1747 5\' 4"  (1.626 m)     Head Circumference --      Peak Flow --      Pain Score 08/13/22 1747 7     Pain Loc --      Pain Edu? --      Excl. in [place]? --    No data found.  Updated Vital Signs BP 116/81 (BP Location: Right Arm)   Pulse 94   Temp (!) 102.7 F (39.3 C) (Oral)   Resp 18   Ht 5\' 4"  (1.626 m)   Wt 127 lb (57.6 kg)   LMP 08/09/2022 (Exact Date)   SpO2 98%   BMI 21.80 kg/m   Visual Acuity Right Eye Distance:   Left Eye Distance:   Bilateral Distance:    Right Eye Near:   Left Eye Near:    Bilateral Near:     Physical Exam Vitals and nursing note reviewed.  Constitutional:      Appearance: She is ill-appearing. She is not toxic-appearing.  HENT:     Head: Normocephalic and atraumatic.     Right Ear: Hearing, ear canal and external ear normal. Tympanic membrane is injected.     Left Ear: Hearing, ear canal and external ear normal. Tympanic membrane is injected.     Nose: Nose normal.     Mouth/Throat:     Lips: Pink.     Mouth: Mucous membranes are moist. No injury.     Tongue: No lesions. Tongue does not deviate from midline.     Palate: No mass and lesions.     Pharynx: Oropharynx is clear. Uvula midline. Posterior oropharyngeal erythema present. No pharyngeal swelling, oropharyngeal exudate or uvula swelling.     Tonsils: No tonsillar exudate or tonsillar abscesses. 1+ on the right. 1+ on the left.  Eyes:     General: Lids are normal. Vision grossly intact. Gaze aligned appropriately.     Extraocular Movements: Extraocular movements intact.     Conjunctiva/sclera: Conjunctivae normal.  Cardiovascular:     Rate and Rhythm: Normal rate and regular rhythm.  Heart sounds: Normal  heart sounds, S1 normal and S2 normal.  Pulmonary:     Effort: Pulmonary effort is normal. No respiratory distress.     Breath sounds: Normal breath sounds and air entry.  Musculoskeletal:     Cervical back: Neck supple.  Skin:    General: Skin is warm and dry.     Capillary Refill: Capillary refill takes less than 2 seconds.     Findings: No rash.  Neurological:     General: No focal deficit present.     Mental Status: She is alert and oriented to person, place, and time. Mental status is at baseline.     Cranial Nerves: No dysarthria or facial asymmetry.  Psychiatric:        Mood and Affect: Mood normal.        Speech: Speech normal.        Behavior: Behavior normal.        Thought Content: Thought content normal.        Judgment: Judgment normal.      UC Treatments / Results  Labs (all labs ordered are listed, but only abnormal results are displayed) Labs Reviewed  CULTURE, GROUP A STREP ([place])  SARS CORONAVIRUS 2 (TAT 6-24 HRS)  POCT RAPID STREP A, ED / UC    EKG   Radiology No results found.  Procedures Procedures (including critical care time)  Medications Ordered in UC Medications  acetaminophen (TYLENOL) tablet 650 mg (650 mg Oral Given 08/13/22 1755)    Initial Impression / Assessment and Plan / UC Course  I have reviewed the triage vital signs and the nursing notes.  Pertinent labs & imaging results that were available during my care of the patient were reviewed by me and considered in my medical decision making (see chart for details).   1. Influenza-like illness, fever Group A strep testing negative.  Throat culture pending.  Patient is ill-appearing, however nontoxic in appearance and with hemodynamically stable vital signs/stable cardiopulmonary exam, therefore deferred imaging of the chest.  High suspicion for influenza due to recent sick contact who was hospitalized as well as clinical presentation today with high fever, generalized bodyaches, and  ill appearance.  Tamiflu sent to be started today and taken as prescribed with food.  COVID-19 testing is pending, will discontinue Tamiflu and start Paxlovid should COVID-19 test come back positive.  Recent CMP from February 2024 shows GFR greater than 60.  She is a candidate for Paxlovid if COVID test is positive due to history of asthma.   Otherwise, we will manage this with prescriptions for symptomatic relief.  Zofran and Tessalon Perles sent to pharmacy to be taken as prescribed as needed.  May continue using Tylenol and ibuprofen as needed for fever, chills, and body aches and pains.  Offered ketorolac 30 mg IM injection, patient declined.  Patient given Tylenol 650 mg in clinic for fever.  Discussed physical exam and available lab work findings in clinic with patient.  Counseled patient regarding appropriate use of medications and potential side effects for all medications recommended or prescribed today. Discussed red flag signs and symptoms of worsening condition,when to call the PCP office, return to urgent care, and when to seek higher level of care in the emergency department. Patient verbalizes understanding and agreement with plan. All questions answered. Patient discharged in stable condition.    Final Clinical Impressions(s) / UC Diagnoses   Final diagnoses:  Influenza-like illness  Fever, unspecified fever cause     Discharge  Instructions      Your symptoms are most likely due to a viral illness, which will improve on its own with rest and fluids. COVID testing is pending.  I would like for you to start taking Tamiflu today twice daily for the next 5 days.  If your COVID-19 test is positive, I will have you stop taking Tamiflu and send in the antiviral for COVID-19.  We will treat your symptoms with the following: - Zofran 4mg  every 8 hours as needed to help with nausea and vomiting.  Eat a bland diet for the next 12-24 hours (bananas, rice, white toast, and applesauce) once  you are able to tolerate liquids (broth, etc). These foods are easy for your stomach to digest. Pedialyte can be purchased to help with rehydration. Drink plenty of water.  - Ibuprofen 600mg  and/or tylenol 1,000mg  every 6 hours as needed for fever/chills and aches/pains. - Guaifenesin (Mucinex) may be purchased over the counter to be taken as directed to reduce nasal congestion/cough - Tessalon perles every 8 hours as needed for coughing  - Two teaspoons of honey (10 ml) can be given by mouth or in warm water every four hours as needed and may decrease the amount of coughing. - Use a cool mist humidifier in your room at night  If you develop any new or worsening symptoms or do not improve in the next 2 to 3 days, please return.  If your symptoms are severe, please go to the emergency room.  Follow-up with your primary care provider for further evaluation and management of your symptoms as well as ongoing wellness visits.  I hope you feel better!     ED Prescriptions     Medication Sig Dispense Auth. Provider   benzonatate (TESSALON) 100 MG capsule  (Status: Discontinued) Take 1 capsule (100 mg total) by mouth every 8 (eight) hours. 21 capsule Joella Prince M, FNP   ondansetron (ZOFRAN-ODT) 4 MG disintegrating tablet  (Status: Discontinued) Take 1 tablet (4 mg total) by mouth every 8 (eight) hours as needed for nausea or vomiting. 20 tablet Talbot Grumbling, FNP   oseltamivir (TAMIFLU) 75 MG capsule Take 1 capsule (75 mg total) by mouth every 12 (twelve) hours. 10 capsule Talbot Grumbling, FNP   ondansetron (ZOFRAN-ODT) 4 MG disintegrating tablet Take 1 tablet (4 mg total) by mouth every 8 (eight) hours as needed for nausea or vomiting. 20 tablet Joella Prince M, FNP   benzonatate (TESSALON) 100 MG capsule Take 1 capsule (100 mg total) by mouth every 8 (eight) hours. 21 capsule Talbot Grumbling, FNP      PDMP not reviewed this encounter.   Talbot Grumbling,  [place] 08/13/22 [phone]

## 2022-08-13 NOTE — Discharge Instructions (Signed)
Your symptoms are most likely due to a viral illness, which will improve on its own with rest and fluids. COVID testing is pending.  I would like for you to start taking Tamiflu today twice daily for the next 5 days.  If your COVID-19 test is positive, I will have you stop taking Tamiflu and send in the antiviral for COVID-19.  We will treat your symptoms with the following: - Zofran 4mg  every 8 hours as needed to help with nausea and vomiting.  Eat a bland diet for the next 12-24 hours (bananas, rice, white toast, and applesauce) once you are able to tolerate liquids (broth, etc). These foods are easy for your stomach to digest. Pedialyte can be purchased to help with rehydration. Drink plenty of water.  - Ibuprofen 600mg  and/or tylenol 1,000mg  every 6 hours as needed for fever/chills and aches/pains. - Guaifenesin (Mucinex) may be purchased over the counter to be taken as directed to reduce nasal congestion/cough - Tessalon perles every 8 hours as needed for coughing  - Two teaspoons of honey (10 ml) can be given by mouth or in warm water every four hours as needed and may decrease the amount of coughing. - Use a cool mist humidifier in your room at night  If you develop any new or worsening symptoms or do not improve in the next 2 to 3 days, please return.  If your symptoms are severe, please go to the emergency room.  Follow-up with your primary care provider for further evaluation and management of your symptoms as well as ongoing wellness visits.  I hope you feel better!

## 2022-08-14 LAB — SARS CORONAVIRUS 2 (TAT 6-24 HRS): SARS Coronavirus 2: NEGATIVE

## 2022-08-16 LAB — CULTURE, GROUP A STREP (THRC)

## 2022-09-05 ENCOUNTER — Ambulatory Visit: Payer: Medicaid Other | Admitting: Obstetrics and Gynecology

## 2023-05-05 ENCOUNTER — Inpatient Hospital Stay (HOSPITAL_COMMUNITY): Payer: Medicaid Other

## 2023-05-05 ENCOUNTER — Encounter (HOSPITAL_COMMUNITY): Payer: Self-pay | Admitting: Obstetrics & Gynecology

## 2023-05-05 ENCOUNTER — Inpatient Hospital Stay (HOSPITAL_COMMUNITY)
Admission: AD | Admit: 2023-05-05 | Discharge: 2023-05-05 | Disposition: A | Discharge status: Home / Self Care | Payer: Medicaid Other | Attending: Obstetrics & Gynecology | Admitting: Obstetrics & Gynecology

## 2023-05-05 DIAGNOSIS — R109 Unspecified abdominal pain: Secondary | ICD-10-CM | POA: Insufficient documentation

## 2023-05-05 DIAGNOSIS — O09292 Supervision of pregnancy with other poor reproductive or obstetric history, second trimester: Secondary | ICD-10-CM | POA: Insufficient documentation

## 2023-05-05 DIAGNOSIS — M549 Dorsalgia, unspecified: Secondary | ICD-10-CM | POA: Insufficient documentation

## 2023-05-05 DIAGNOSIS — O26891 Other specified pregnancy related conditions, first trimester: Secondary | ICD-10-CM | POA: Insufficient documentation

## 2023-05-05 DIAGNOSIS — O3680X0 Pregnancy with inconclusive fetal viability, not applicable or unspecified: Secondary | ICD-10-CM | POA: Insufficient documentation

## 2023-05-05 DIAGNOSIS — Z8759 Personal history of other complications of pregnancy, childbirth and the puerperium: Secondary | ICD-10-CM | POA: Insufficient documentation

## 2023-05-05 DIAGNOSIS — Z3A01 Less than 8 weeks gestation of pregnancy: Secondary | ICD-10-CM | POA: Insufficient documentation

## 2023-05-05 LAB — CBC
HCT: 36.3 % (ref 36.0–46.0)
Hemoglobin: 12.6 g/dL (ref 12.0–15.0)
MCH: 30.5 pg (ref 26.0–34.0)
MCHC: 34.7 g/dL (ref 30.0–36.0)
MCV: 87.9 fL (ref 80.0–100.0)
Platelets: 334 10*3/uL (ref 150–400)
RBC: 4.13 MIL/uL (ref 3.87–5.11)
RDW: 12.2 % (ref 11.5–15.5)
WBC: 5.8 10*3/uL (ref 4.0–10.5)
nRBC: 0 % (ref 0.0–0.2)

## 2023-05-05 LAB — COMPREHENSIVE METABOLIC PANEL
ALT: 14 U/L (ref 0–44)
AST: 22 U/L (ref 15–41)
Albumin: 4 g/dL (ref 3.5–5.0)
Alkaline Phosphatase: 47 U/L (ref 38–126)
Anion gap: 8 (ref 5–15)
BUN: 14 mg/dL (ref 6–20)
CO2: 22 mmol/L (ref 22–32)
Calcium: 8.9 mg/dL (ref 8.9–10.3)
Chloride: 106 mmol/L (ref 98–111)
Creatinine, Ser: 0.81 mg/dL (ref 0.44–1.00)
GFR, Estimated: >60 mL/min (ref >60)
Glucose, Bld: 99 mg/dL (ref 70–99)
Potassium: 4.2 mmol/L (ref 3.5–5.1)
Sodium: 136 mmol/L (ref 135–145)
Total Bilirubin: 0.6 mg/dL (ref <1.2)
Total Protein: 7 g/dL (ref 6.5–8.1)

## 2023-05-05 LAB — POCT PREGNANCY, URINE: Preg Test, Ur: POSITIVE — AB

## 2023-05-05 LAB — ABO/RH: ABO/RH(D): O POS

## 2023-05-05 LAB — HCG, QUANTITATIVE, PREGNANCY: hCG, Beta Chain, Quant, S: 71 m[IU]/mL — ABNORMAL HIGH (ref <5)

## 2023-05-05 NOTE — MAU Provider Note (Signed)
[place]     CSN: [id number]  Arrival date and time: 05/05/23 1324   Event Date/Time   First Provider Initiated Contact with Patient 05/05/23 2230      Chief Complaint  Patient presents with   Abdominal Pain   Back Pain    Kristy Miller is a 27 y.o. G4P1011 at [redacted]w[redacted]d by Approximate LMP of 04/05/2023.  She presents today for abdominal pain.  She states the pain started yesterday and is intermittent.  She reports it is located in lower abdominal area and feels it is worsened with increased activity such as lifting and carrying.  She states it is improved with rest.  Patient with history of ectopic pregnancy with right salpingectomy.  OB History     Gravida  4   Para  1   Term  1   Preterm      AB  1   Living  1      SAB  1   IAB      Ectopic      Multiple  0   Live Births  1        Obstetric Comments  BP started going up at end of pregnancy.          Past Medical History:  Diagnosis Date   Anxiety    Asthma    nothing after age 81   Depression    doing good   Eczema    UTI (urinary tract infection)     Past Surgical History:  Procedure Laterality Date   DIAGNOSTIC LAPAROSCOPY WITH REMOVAL OF ECTOPIC PREGNANCY Right 07/11/2022   Procedure: DIAGNOSTIC LAPAROSCOPY WITH REMOVAL OF ECTOPIC PREGNANCY;  Surgeon: Warden Fillers, MD;  Location: [facility];  Service: Gynecology;  Laterality: Right;   HERNIA REPAIR     umbilical, age 30   OTHER SURGICAL HISTORY     age 66 , surgery on abd? not sure     Family History  Adopted: Yes    Social History   Tobacco Use   Smoking status: Never   Smokeless tobacco: Never   Tobacco comments:    Quit vaping  with + preg  Vaping Use   Vaping status: Some Days   Substances: Nicotine, Flavoring  Substance Use Topics   Alcohol use: Yes    Comment: Rarely   Drug use: Not Currently    Types: Marijuana    Comment: none- quit early 2023    Allergies: No Known Allergies  Medications Prior to Admission   Medication Sig Dispense Refill Last Dose/Taking   dextroamphetamine (DEXTROSTAT) 10 MG tablet Take 10 mg by mouth daily.   Past Week   escitalopram (LEXAPRO) 10 MG tablet Take 10 mg by mouth daily.   Past Week   benzonatate (TESSALON) 100 MG capsule Take 1 capsule (100 mg total) by mouth every 8 (eight) hours. 21 capsule 0    diclofenac (VOLTAREN) 75 MG EC tablet Take by mouth.      ibuprofen (ADVIL) 600 MG tablet Take 1 tablet (600 mg total) by mouth every 6 (six) hours as needed. 60 tablet 3    ondansetron (ZOFRAN-ODT) 4 MG disintegrating tablet Take 1 tablet (4 mg total) by mouth every 8 (eight) hours as needed for nausea or vomiting. 20 tablet 0    oseltamivir (TAMIFLU) 75 MG capsule Take 1 capsule (75 mg total) by mouth every 12 (twelve) hours. 10 capsule 0    oxyCODONE (ROXICODONE) 5 MG immediate release tablet Take 1 tablet (5  mg total) by mouth every 4 (four) hours as needed for severe pain. 20 tablet 0     Review of Systems  Gastrointestinal:  Positive for abdominal pain. Negative for constipation, diarrhea, nausea and vomiting.  Genitourinary:  Negative for difficulty urinating, dysuria, vaginal bleeding and vaginal discharge.   Physical Exam   Blood pressure 125/82, pulse 73, temperature 98.5 F (36.9 C), temperature source Oral, resp. rate 16, height 5\' 4"  (1.626 m), weight 58.3 kg, last menstrual period 04/05/2023, SpO2 100%, unknown if currently breastfeeding.  Physical Exam  MAU Course  Procedures Results for orders placed or performed during the hospital encounter of 05/05/23 (from the past 24 hours)  Pregnancy, urine POC     Status: Abnormal   Collection Time: 05/05/23  6:50 PM  Result Value Ref Range   Preg Test, Ur POSITIVE (A) NEGATIVE  CBC     Status: None   Collection Time: 05/05/23  7:30 PM  Result Value Ref Range   WBC 5.8 4.0 - 10.5 K/uL   RBC 4.13 3.87 - 5.11 MIL/uL   Hemoglobin 12.6 12.0 - 15.0 g/dL   HCT 19.1 47.8 - 29.5 %   MCV 87.9 80.0 - 100.0 fL    MCH 30.5 26.0 - 34.0 pg   MCHC 34.7 30.0 - 36.0 g/dL   RDW 62.1 30.8 - 65.7 %   Platelets 334 150 - 400 K/uL   nRBC 0.0 0.0 - 0.2 %  Comprehensive metabolic panel     Status: None   Collection Time: 05/05/23  7:30 PM  Result Value Ref Range   Sodium 136 135 - 145 mmol/L   Potassium 4.2 3.5 - 5.1 mmol/L   Chloride 106 98 - 111 mmol/L   CO2 22 22 - 32 mmol/L   Glucose, Bld 99 70 - 99 mg/dL   BUN 14 6 - 20 mg/dL   Creatinine, Ser 8.46 0.44 - 1.00 mg/dL   Calcium 8.9 8.9 - 96.2 mg/dL   Total Protein 7.0 6.5 - 8.1 g/dL   Albumin 4.0 3.5 - 5.0 g/dL   AST 22 15 - 41 U/L   ALT 14 0 - 44 U/L   Alkaline Phosphatase 47 38 - 126 U/L   Total Bilirubin 0.6 <1.2 mg/dL   GFR, Estimated >95 >28 mL/min   Anion gap 8 5 - 15  ABO/Rh     Status: None   Collection Time: 05/05/23  7:30 PM  Result Value Ref Range   ABO/RH(D) O POS    No rh immune globuloin      NOT A RH IMMUNE GLOBULIN CANDIDATE, PT RH POSITIVE Performed at [facility], [address] [place], [place] [address]   hCG, quantitative, pregnancy     Status: Abnormal   Collection Time: 05/05/23  7:30 PM  Result Value Ref Range   hCG, Beta Chain, Quant, S 71 (H) <5 mIU/mL   US OB LESS THAN 14 WEEKS WITH OB TRANSVAGINAL Result Date: 05/05/2023 CLINICAL DATA:  Cramping EXAM: OBSTETRIC <14 WK [place] AND TRANSVAGINAL OB US TECHNIQUE: Both transabdominal and transvaginal ultrasound examinations were performed for complete evaluation of the gestation as well as the maternal uterus, adnexal regions, and pelvic cul-de-sac. Transvaginal technique was performed to assess early pregnancy. COMPARISON:  None Available. FINDINGS: Intrauterine gestational sac: None Yolk sac:  Not Visualized. Embryo:  Not Visualized. Cardiac Activity: Not Visualized. Heart Rate:   bpm MSD:   mm    w     d CRL:  mm    w    d                  [place] EDC: Subchorionic hemorrhage:  None visualized. Maternal uterus/adnexae: No adnexal mass. Small amount of free fluid in  the pelvis. IMPRESSION: No intrauterine pregnancy visualized. Differential considerations would include early intrauterine pregnancy too early to visualize, spontaneous abortion, or occult ectopic pregnancy. Recommend close clinical followup and serial quantitative beta HCGs and ultrasounds. Electronically Signed   By: Charlett Nose M.D.   On: 05/05/2023 22:26    MDM Physical Exam Labs: UA, UPT, CBC, CMP, hCG, ABO Ultrasound Coordination of Care Assessment and Plan  27 year old G4P1011 at 4.2 weeks Abdominal Cramping Pregnancy of Unknown Location  -Orders placed by previous provider while patient in triage. -Results as above. -Provider to discuss. -Informed of need for repeat testing in 48 hours at MAU.  -Patient offered and declines pain medication. -Encouraged hydration and rest when needed in regards to cramping. -Encouraged to call primary office or return to MAU if symptoms worsen or with the onset of new symptoms. -Discharged to home in stable condition.   Cherre Robins 05/05/2023, 10:30 PM

## 2023-05-05 NOTE — MAU Note (Signed)
...  Kristy Miller is a 27 y.o. at Unknown here in MAU reporting: Positive pregnancy test at home three days ago. She reports she had an ectopic pregnancy this past February and she is afraid of losing her other fallopian tube. She reports occasional lower abdomen and lower back pain that began around 24 hours ago. She does report lifting heavy things yesterday. Denies VB.   LMP: 04/05/2023 Onset of complaint: Yesterday  Pain scores:  4/10 lower abdomen 4/10 back  Vitals:   05/05/23 1849  BP: 125/82  Pulse: 73  Resp: 16  Temp: 98.5 F (36.9 C)  SpO2: 100%     FHT: n/a Lab orders placed from triage:  POCT Preg

## 2023-05-07 ENCOUNTER — Inpatient Hospital Stay (HOSPITAL_COMMUNITY)
Admission: AD | Admit: 2023-05-07 | Discharge: 2023-05-07 | Discharge status: Against Medical Advice | Payer: Medicaid Other | Attending: Obstetrics & Gynecology | Admitting: Obstetrics & Gynecology

## 2023-05-21 ENCOUNTER — Inpatient Hospital Stay (HOSPITAL_COMMUNITY): Payer: Medicaid Other

## 2023-05-21 ENCOUNTER — Telehealth: Payer: Self-pay | Admitting: Obstetrics and Gynecology

## 2023-05-21 ENCOUNTER — Encounter (HOSPITAL_COMMUNITY): Payer: Self-pay | Admitting: Obstetrics & Gynecology

## 2023-05-21 ENCOUNTER — Other Ambulatory Visit: Payer: Self-pay

## 2023-05-21 ENCOUNTER — Inpatient Hospital Stay (HOSPITAL_COMMUNITY)
Admission: AD | Admit: 2023-05-21 | Discharge: 2023-05-21 | Disposition: A | Discharge status: Home / Self Care | Payer: Medicaid Other | Attending: Obstetrics & Gynecology | Admitting: Obstetrics & Gynecology

## 2023-05-21 ENCOUNTER — Encounter: Payer: Self-pay | Admitting: Obstetrics and Gynecology

## 2023-05-21 DIAGNOSIS — O09291 Supervision of pregnancy with other poor reproductive or obstetric history, first trimester: Secondary | ICD-10-CM | POA: Insufficient documentation

## 2023-05-21 DIAGNOSIS — Z3A01 Less than 8 weeks gestation of pregnancy: Secondary | ICD-10-CM | POA: Insufficient documentation

## 2023-05-21 DIAGNOSIS — O99511 Diseases of the respiratory system complicating pregnancy, first trimester: Secondary | ICD-10-CM | POA: Diagnosis not present

## 2023-05-21 DIAGNOSIS — Z3490 Encounter for supervision of normal pregnancy, unspecified, unspecified trimester: Secondary | ICD-10-CM

## 2023-05-21 DIAGNOSIS — O3481 Maternal care for other abnormalities of pelvic organs, first trimester: Secondary | ICD-10-CM | POA: Insufficient documentation

## 2023-05-21 DIAGNOSIS — Z3491 Encounter for supervision of normal pregnancy, unspecified, first trimester: Secondary | ICD-10-CM

## 2023-05-21 DIAGNOSIS — O26891 Other specified pregnancy related conditions, first trimester: Secondary | ICD-10-CM | POA: Insufficient documentation

## 2023-05-21 DIAGNOSIS — O99331 Smoking (tobacco) complicating pregnancy, first trimester: Secondary | ICD-10-CM | POA: Diagnosis not present

## 2023-05-21 LAB — HCG, QUANTITATIVE, PREGNANCY: hCG, Beta Chain, Quant, S: 16342 m[IU]/mL — ABNORMAL HIGH (ref <5)

## 2023-05-21 NOTE — Telephone Encounter (Signed)
-   Contacted patient's mother, Kristy Miller, at [phone] to verify or get patient's number. Kristy Miller gave number for patient of [phone] NOT [phone] (like what was in Epic). - New number of patient sent to [facility]'s Admin Pool to call patient to get NOB intake and NOB appt setup.  Kristy Miller, CNM

## 2023-05-21 NOTE — MAU Provider Note (Signed)
 History     CSN: [id number]  Arrival date and time: 05/21/23 1009   Event Date/Time   First Provider Initiated Contact with Patient 05/21/23 1036      Chief Complaint  Patient presents with   HCG Level   HPI Ms. Kristy Miller is a 28 y.o. year old [id number] female at [redacted]w[redacted]d weeks gestation by LMP of 04/05/2023 who presents to MAU reporting she was supposed to have HCG drawn on 05/07/2023 and she needed that and a repeat U/S done today. She states that when she arrived at the registration desk she was told she could not be seen because she had her 59 yo daughter with her. She then had to leave and could not come back until today when she had another child care option. She denies any VB or pain at this time.  OB History     Gravida  3   Para  1   Term  1   Preterm      AB  1   Living  1      SAB  1   IAB      Ectopic      Multiple  0   Live Births  1        Obstetric Comments  BP started going up at end of pregnancy.          Past Medical History:  Diagnosis Date   Anxiety    Asthma    nothing after age 54   Depression    doing good   Eczema    UTI (urinary tract infection)     Past Surgical History:  Procedure Laterality Date   DIAGNOSTIC LAPAROSCOPY WITH REMOVAL OF ECTOPIC PREGNANCY Right 07/11/2022   Procedure: DIAGNOSTIC LAPAROSCOPY WITH REMOVAL OF ECTOPIC PREGNANCY;  Surgeon: Zina Jerilynn LABOR, MD;  Location: [facility] OR;  Service: Gynecology;  Laterality: Right;   HERNIA REPAIR     umbilical, age 25   OTHER SURGICAL HISTORY     age 59 , surgery on abd? not sure     Family History  Adopted: Yes    Social History   Tobacco Use   Smoking status: Never   Smokeless tobacco: Never   Tobacco comments:    Quit vaping  with + preg  Vaping Use   Vaping status: Some Days   Substances: Nicotine, Flavoring  Substance Use Topics   Alcohol use: Yes    Comment: Rarely   Drug use: Not Currently    Types: Marijuana    Comment: none- quit early 2023     Allergies: No Known Allergies  No medications prior to admission.    Review of Systems  Constitutional: Negative.   HENT: Negative.    Eyes: Negative.   Respiratory: Negative.    Cardiovascular: Negative.   Gastrointestinal: Negative.   Endocrine: Negative.   Genitourinary: Negative.   Musculoskeletal: Negative.   Skin: Negative.   Allergic/Immunologic: Negative.   Neurological: Negative.   Hematological: Negative.   Psychiatric/Behavioral: Negative.     Physical Exam   Blood pressure 114/75, pulse 71, temperature 98 F (36.7 C), temperature source Oral, resp. rate (!) 73, last menstrual period 04/05/2023, SpO2 100%, unknown if currently breastfeeding.  Physical Exam Vitals and nursing note reviewed.  Constitutional:      Appearance: Normal appearance. She is normal weight.  Cardiovascular:     Rate and Rhythm: Normal rate.  Pulmonary:     Effort: Pulmonary effort is normal.  Genitourinary:  Comments: Not indicated Musculoskeletal:        General: Normal range of motion.  Neurological:     Mental Status: She is alert and oriented to person, place, and time.  Psychiatric:        Mood and Affect: Mood normal.        Behavior: Behavior normal.        Thought Content: Thought content normal.        Judgment: Judgment normal.   MAU Course  Procedures  MDM HCG OB <14 wks TVUS  Results for orders placed or performed during the hospital encounter of 05/21/23 (from the past 24 hours)  hCG, quantitative, pregnancy     Status: Abnormal   Collection Time: 05/21/23 10:31 AM  Result Value Ref Range   hCG, Beta Chain, Quant, S 16,342 (H) <5 mIU/mL     US  OB Transvaginal Result Date: 05/21/2023 CLINICAL DATA:  Early pregnancy. Evaluate for viability. LMP: 04/05/2023 corresponding to an estimated gestational age of [redacted] weeks, 4 days. EXAM: TRANSVAGINAL OB ULTRASOUND TECHNIQUE: Transvaginal ultrasound was performed for complete evaluation of the gestation as well as  the maternal uterus, adnexal regions, and pelvic cul-de-sac. COMPARISON:  None Available. FINDINGS: Intrauterine gestational sac: Single intrauterine gestational sac. Yolk sac:  Seen Embryo:  Present Cardiac Activity: Detected Heart Rate: 110 bpm CRL:   3 mm   6 w 0 d                  US  EDC: 01/14/2024 Subchorionic hemorrhage:  None visualized. Maternal uterus/adnexae: The maternal ovaries are unremarkable. A 3 cm cyst in the left ovary, physiologic. Small free fluid within the pelvis, also physiologic. IMPRESSION: Single live intrauterine pregnancy with an estimated gestational age of [redacted] weeks, 0 days by ultrasound, concordant with age based on LMP. Electronically Signed   By: Vanetta Chou M.D.   On: 05/21/2023 11:43   Assessment and Plan  1. Intrauterine pregnancy (Primary) - Advised to start [facility] in 5-6 wks - Information provided on [facility] Prenatal Providers    2. [redacted] weeks gestation of pregnancy   - Discharge patient - Advised to schedule PNC at [facility]. Msg sent to [facility] to get patient scheduled.  - Patient verbalized an understanding of the plan of care and agrees.    Rhyker Silversmith, CNM 05/21/2023, 10:36 AM

## 2023-05-21 NOTE — Discharge Instructions (Addendum)
 HAPPY BELATED BIRTHDAY, Kristy Miller!! ~Earlie Arciga Letha - Midwife  [facility] Ob/Gyn Providers   Center for [employer] [facility]             [address] [place], [place] [address] [phone]  [facility] for [employer] at [facility]                                                             [address], [place], [place], [address] [phone] for [facility] at [facility] [address], [place], [place], [address] [phone]  Center for [facility] at [facility] [address], [address], [place], [place], [address] [phone]  Center for [facility] at [facility]                                 [address] [place], [place], [place], [address] [phone]  Center for [facility] at [facility]                                    [address] [place], [place], [address] [phone]  Center for [facility] at [facility] [address], [place], [place], [address]                              [facility] of Between [address], [address], [place], [place], [address] [phone]  [facility]         Phone: [phone]  [facility]      Phone: [phone]   [facility]      Phone: [phone]  [facility]         Phone: [phone]   [facility]-Maternity    Phone: [phone]  [facility]      Phone: [phone]  [facility]     Phone: [phone]  [facility]        Phone: [phone]  [facility]) [phone]  [facility]      Phone: [phone]

## 2023-05-21 NOTE — MAU Note (Signed)
 Kristy Miller is a 28 y.o. at [redacted]w[redacted]d here in MAU reporting: she's here for repeat blood work.  Denies pain or VB.  LMP: 04/05/2023 Onset of complaint: ongoing Pain score: 0 Vitals:   05/21/23 1022  BP: 114/75  Pulse: 71  Resp: (!) 73  Temp: 98 F (36.7 C)  SpO2: 100%     FHT:NA Lab orders placed from triage: HCG

## 2023-06-22 ENCOUNTER — Encounter: Payer: Self-pay | Admitting: Certified Nurse Midwife

## 2023-06-22 ENCOUNTER — Ambulatory Visit: Payer: Medicaid Other | Admitting: Certified Nurse Midwife

## 2023-06-22 DIAGNOSIS — Z3480 Encounter for supervision of other normal pregnancy, unspecified trimester: Secondary | ICD-10-CM | POA: Insufficient documentation

## 2023-06-22 NOTE — Progress Notes (Signed)
 Patient did not attend appointment. May reschedule.  Raford Bunk, MSN, CNM, RNC-OB Certified Nurse Midwife, [facility] 06/22/2023 1:07 PM
